# Patient Record
Sex: Female | Born: 1970 | Hispanic: Yes | Marital: Married | Smoking: Never smoker
Health system: Southern US, Community
[De-identification: ages and names within clinical notes are randomized; demographics above are authoritative.]

## PROBLEM LIST (undated history)

## (undated) DIAGNOSIS — I10 Essential (primary) hypertension: Secondary | ICD-10-CM

## (undated) HISTORY — PX: ABDOMINAL HYSTERECTOMY: SHX81

---

## 2003-12-27 ENCOUNTER — Ambulatory Visit: Payer: Self-pay | Admitting: Internal Medicine

## 2004-06-20 ENCOUNTER — Ambulatory Visit: Payer: Self-pay | Admitting: General Practice

## 2005-11-25 ENCOUNTER — Ambulatory Visit: Payer: Self-pay | Admitting: Family Medicine

## 2007-10-20 ENCOUNTER — Ambulatory Visit: Payer: Self-pay | Admitting: Family Medicine

## 2010-07-10 ENCOUNTER — Ambulatory Visit: Payer: Self-pay | Admitting: Family Medicine

## 2010-10-13 ENCOUNTER — Emergency Department: Payer: Self-pay | Admitting: Emergency Medicine

## 2011-07-16 ENCOUNTER — Ambulatory Visit: Payer: Self-pay | Admitting: Family Medicine

## 2012-09-02 ENCOUNTER — Ambulatory Visit: Payer: Self-pay | Admitting: Family Medicine

## 2012-11-30 ENCOUNTER — Ambulatory Visit: Payer: Self-pay | Admitting: Obstetrics and Gynecology

## 2013-02-16 ENCOUNTER — Ambulatory Visit: Payer: Self-pay | Admitting: Obstetrics and Gynecology

## 2013-02-16 LAB — COMPREHENSIVE METABOLIC PANEL
ALK PHOS: 107 U/L
ALT: 36 U/L (ref 12–78)
AST: 34 U/L (ref 15–37)
Albumin: 4.1 g/dL (ref 3.4–5.0)
Anion Gap: 6 — ABNORMAL LOW (ref 7–16)
BILIRUBIN TOTAL: 0.4 mg/dL (ref 0.2–1.0)
BUN: 10 mg/dL (ref 7–18)
CO2: 25 mmol/L (ref 21–32)
Calcium, Total: 9 mg/dL (ref 8.5–10.1)
Chloride: 106 mmol/L (ref 98–107)
Creatinine: 0.76 mg/dL (ref 0.60–1.30)
EGFR (African American): >60
Glucose: 84 mg/dL (ref 65–99)
Osmolality: 272 (ref 275–301)
Potassium: 3.8 mmol/L (ref 3.5–5.1)
SODIUM: 137 mmol/L (ref 136–145)
Total Protein: 7.8 g/dL (ref 6.4–8.2)

## 2013-02-16 LAB — HEMOGLOBIN: HGB: 13.5 g/dL (ref 12.0–16.0)

## 2013-02-22 ENCOUNTER — Ambulatory Visit: Payer: Self-pay | Admitting: Obstetrics and Gynecology

## 2013-02-23 LAB — BASIC METABOLIC PANEL
Anion Gap: 4 — ABNORMAL LOW (ref 7–16)
BUN: 9 mg/dL (ref 7–18)
CREATININE: 0.76 mg/dL (ref 0.60–1.30)
Calcium, Total: 8.5 mg/dL (ref 8.5–10.1)
Chloride: 108 mmol/L — ABNORMAL HIGH (ref 98–107)
Co2: 26 mmol/L (ref 21–32)
EGFR (Non-African Amer.): >60
GLUCOSE: 117 mg/dL — AB (ref 65–99)
Osmolality: 275 (ref 275–301)
Potassium: 3.8 mmol/L (ref 3.5–5.1)
Sodium: 138 mmol/L (ref 136–145)

## 2013-02-23 LAB — HEMATOCRIT: HCT: 37.8 % (ref 35.0–47.0)

## 2013-02-23 LAB — PATHOLOGY REPORT

## 2014-05-21 NOTE — Op Note (Signed)
PATIENT NAME:  Nicole Hale, [name] MR#:  [id number] DATE OF BIRTH:  [birth year]  DATE OF PROCEDURE:  02/22/2013  PREOPERATIVE DIAGNOSES: 1.  Chronic pelvic pain.  2.  Fibroid uterus.   POSTOPERATIVE DIAGNOSES:   1.  Chronic pelvic pain.  2.  Fibroid uterus.   PROCEDURES: 1.  Total vaginal hysterectomy.  2.  Bilateral salpingectomy.   ANESTHESIA:  General endotracheal.  SURGEON: Suzy Bouchardhomas J. Schermerhorn, M.D.   FIRST ASSISTANT: Logan BoresEvans.   INDICATIONS: This is a 44 year old gravida 2, para 2 patient with a symptomatic fibroid uterus and chronic pain, left greater than right. The patient is known to have diverticulosis.   PROCEDURE IN DETAIL: After adequate general endotracheal anesthesia, the patient was placed in the dorsal supine position. The abdomen, perineum and vagina were prepped in normal sterile fashion. A weighted speculum was placed into the posterior vaginal vault. The anterior cervix was then grasped with a thyroid tenaculum, and the cervix was circumferentially injected with 0.5% lidocaine with 1:100,000 epinephrine. A direct posterior colpotomy incision was made. Upon entry into the posterior cul-de-sac, a long-bill weighted speculum was placed. Uterosacral ligaments were bilaterally clamped, transected and suture ligated with 0 Vicryl suture. The anterior cervix was circumferentially incised with the Bovie, and the anterior cul-de-sac was entered without difficulty.  Deaver retractor placed into the anterior cul-de-sac to reflect the bladder anteriorly. The cardinal ligaments were bilaterally clamped, transected and suture ligated with 0 Vicryl suture. The uterine arteries were bilaterally clamped, transected and suture ligated with 0 Vicryl suture. The cornua were then clamped, transected and pedicles were ligated with 0 Vicryl suture. Of note, there was a 3 x 3 cm left fundal fibroid noted. The ovaries appeared normal. The fallopian tubes were then grasped and clamped and removed  separately, and the pedicles were closed with 0 Vicryl suture. Good hemostasis was noted. The peritoneum was then closed with a pursestring 2-0 PDS suture. The vaginal vault was closed with a running 0 Vicryl suture. The uterosacral ligaments were plicated centrally and the rest of the vaginal vault closed with 0 Vicryl suture. Good hemostasis was noted. No complications. Estimated blood loss 25 mL urine output at the beginning of case 50 mL, and a Foley was placed at the end and normal, clear urine resulted. Intraoperative fluids 700 mL. The patient did receive 2 grams of IV cefoxitin prior to commencement of the case.     ____________________________ Suzy Bouchardhomas J. Schermerhorn, MD tjs:dmm D: 02/22/2013 11:11:19 ET T: 02/22/2013 12:13:05 ET JOB#: [id number]  cc: Suzy Bouchardhomas J. Schermerhorn, MD, <Dictator> Suzy BouchardHOMAS J SCHERMERHORN MD ELECTRONICALLY SIGNED 03/05/2013 11:40

## 2014-08-17 ENCOUNTER — Other Ambulatory Visit: Payer: Self-pay | Admitting: Orthopedic Surgery

## 2014-08-17 DIAGNOSIS — M5416 Radiculopathy, lumbar region: Secondary | ICD-10-CM

## 2014-08-25 ENCOUNTER — Ambulatory Visit
Admission: RE | Admit: 2014-08-25 | Discharge: 2014-08-25 | Disposition: A | Discharge status: Home / Self Care | Payer: BC Managed Care – PPO | Source: Ambulatory Visit | Attending: Orthopedic Surgery | Admitting: Orthopedic Surgery

## 2014-08-25 DIAGNOSIS — M5126 Other intervertebral disc displacement, lumbar region: Secondary | ICD-10-CM | POA: Insufficient documentation

## 2014-08-25 DIAGNOSIS — M5416 Radiculopathy, lumbar region: Secondary | ICD-10-CM | POA: Diagnosis present

## 2014-10-04 ENCOUNTER — Other Ambulatory Visit: Payer: Self-pay | Admitting: Family Medicine

## 2014-10-04 DIAGNOSIS — Z1231 Encounter for screening mammogram for malignant neoplasm of breast: Secondary | ICD-10-CM

## 2014-10-19 ENCOUNTER — Ambulatory Visit
Admission: RE | Admit: 2014-10-19 | Discharge: 2014-10-19 | Disposition: A | Discharge status: Home / Self Care | Payer: BC Managed Care – PPO | Source: Ambulatory Visit | Attending: Family Medicine | Admitting: Family Medicine

## 2014-10-19 DIAGNOSIS — Z1231 Encounter for screening mammogram for malignant neoplasm of breast: Secondary | ICD-10-CM | POA: Diagnosis not present

## 2015-12-06 ENCOUNTER — Other Ambulatory Visit: Payer: Self-pay | Admitting: Family Medicine

## 2015-12-06 DIAGNOSIS — Z1231 Encounter for screening mammogram for malignant neoplasm of breast: Secondary | ICD-10-CM

## 2016-01-12 ENCOUNTER — Ambulatory Visit
Admission: RE | Admit: 2016-01-12 | Discharge: 2016-01-12 | Disposition: A | Discharge status: Home / Self Care | Payer: BC Managed Care – PPO | Source: Ambulatory Visit | Attending: Family Medicine | Admitting: Family Medicine

## 2016-01-12 DIAGNOSIS — Z1231 Encounter for screening mammogram for malignant neoplasm of breast: Secondary | ICD-10-CM | POA: Insufficient documentation

## 2016-11-06 ENCOUNTER — Other Ambulatory Visit: Payer: Self-pay | Admitting: Family Medicine

## 2016-11-06 DIAGNOSIS — Z1231 Encounter for screening mammogram for malignant neoplasm of breast: Secondary | ICD-10-CM

## 2017-02-13 ENCOUNTER — Ambulatory Visit
Admission: RE | Admit: 2017-02-13 | Discharge: 2017-02-13 | Disposition: A | Discharge status: Home / Self Care | Payer: BC Managed Care – PPO | Source: Ambulatory Visit | Attending: Family Medicine | Admitting: Family Medicine

## 2017-02-13 DIAGNOSIS — Z1231 Encounter for screening mammogram for malignant neoplasm of breast: Secondary | ICD-10-CM | POA: Diagnosis present

## 2018-02-16 ENCOUNTER — Other Ambulatory Visit: Payer: Self-pay | Admitting: Gastroenterology

## 2018-02-16 DIAGNOSIS — R945 Abnormal results of liver function studies: Principal | ICD-10-CM

## 2018-02-16 DIAGNOSIS — R7989 Other specified abnormal findings of blood chemistry: Secondary | ICD-10-CM

## 2018-02-19 ENCOUNTER — Ambulatory Visit
Admission: RE | Admit: 2018-02-19 | Discharge: 2018-02-19 | Disposition: A | Discharge status: Home / Self Care | Payer: BC Managed Care – PPO | Source: Ambulatory Visit | Attending: Gastroenterology | Admitting: Gastroenterology

## 2018-02-19 DIAGNOSIS — R945 Abnormal results of liver function studies: Secondary | ICD-10-CM | POA: Insufficient documentation

## 2018-02-19 DIAGNOSIS — R7989 Other specified abnormal findings of blood chemistry: Secondary | ICD-10-CM

## 2018-03-30 ENCOUNTER — Other Ambulatory Visit: Payer: Self-pay | Admitting: Family Medicine

## 2018-03-30 DIAGNOSIS — Z1231 Encounter for screening mammogram for malignant neoplasm of breast: Secondary | ICD-10-CM

## 2018-04-08 ENCOUNTER — Ambulatory Visit
Admission: RE | Admit: 2018-04-08 | Discharge: 2018-04-08 | Disposition: A | Discharge status: Home / Self Care | Payer: BC Managed Care – PPO | Source: Ambulatory Visit | Attending: Family Medicine | Admitting: Family Medicine

## 2018-04-08 ENCOUNTER — Other Ambulatory Visit: Payer: Self-pay

## 2018-04-08 DIAGNOSIS — Z1231 Encounter for screening mammogram for malignant neoplasm of breast: Secondary | ICD-10-CM | POA: Diagnosis not present

## 2018-08-11 ENCOUNTER — Emergency Department: Payer: BC Managed Care – PPO

## 2018-08-11 ENCOUNTER — Emergency Department
Admission: EM | Admit: 2018-08-11 | Discharge: 2018-08-11 | Disposition: A | Discharge status: Home / Self Care | Payer: BC Managed Care – PPO | Attending: Emergency Medicine | Admitting: Emergency Medicine

## 2018-08-11 ENCOUNTER — Other Ambulatory Visit: Payer: Self-pay

## 2018-08-11 DIAGNOSIS — W109XXA Fall (on) (from) unspecified stairs and steps, initial encounter: Secondary | ICD-10-CM | POA: Diagnosis not present

## 2018-08-11 DIAGNOSIS — Y9389 Activity, other specified: Secondary | ICD-10-CM | POA: Insufficient documentation

## 2018-08-11 DIAGNOSIS — S0990XA Unspecified injury of head, initial encounter: Secondary | ICD-10-CM | POA: Diagnosis present

## 2018-08-11 DIAGNOSIS — Y999 Unspecified external cause status: Secondary | ICD-10-CM | POA: Diagnosis not present

## 2018-08-11 DIAGNOSIS — Q7192 Unspecified reduction defect of left upper limb: Secondary | ICD-10-CM

## 2018-08-11 DIAGNOSIS — S52502A Unspecified fracture of the lower end of left radius, initial encounter for closed fracture: Secondary | ICD-10-CM

## 2018-08-11 DIAGNOSIS — S52602A Unspecified fracture of lower end of left ulna, initial encounter for closed fracture: Secondary | ICD-10-CM | POA: Diagnosis not present

## 2018-08-11 DIAGNOSIS — S01411A Laceration without foreign body of right cheek and temporomandibular area, initial encounter: Secondary | ICD-10-CM | POA: Diagnosis not present

## 2018-08-11 DIAGNOSIS — Y92009 Unspecified place in unspecified non-institutional (private) residence as the place of occurrence of the external cause: Secondary | ICD-10-CM | POA: Insufficient documentation

## 2018-08-11 MED ORDER — OXYCODONE-ACETAMINOPHEN 5-325 MG PO TABS: 1.0000 | ORAL_TABLET | ORAL | 0 refills | Status: AC | PRN

## 2018-08-11 MED ORDER — LIDOCAINE HCL (PF) 1 % IJ SOLN
5.0000 mL | Freq: Once | INTRAMUSCULAR | Status: AC
  Administered 2018-08-11: 17:00:00 5 mL via INTRADERMAL
  Filled 2018-08-11: qty 5

## 2018-08-11 MED ORDER — HYDROMORPHONE HCL 1 MG/ML IJ SOLN
1.0000 mg | INTRAMUSCULAR | Status: AC
  Administered 2018-08-11: 18:00:00 1 mg via INTRAVENOUS
  Filled 2018-08-11: qty 1

## 2018-08-11 MED ORDER — ONDANSETRON 4 MG PO TBDP: 4.0000 mg | ORAL_TABLET | Freq: Once | ORAL | Status: DC

## 2018-08-11 MED ORDER — ETOMIDATE 2 MG/ML IV SOLN
0.3000 mg/kg | Freq: Once | INTRAVENOUS | Status: AC
  Administered 2018-08-11: 21.52 mg via INTRAVENOUS
  Filled 2018-08-11: qty 20

## 2018-08-11 MED ORDER — ONDANSETRON HCL 4 MG/2ML IJ SOLN
4.0000 mg | Freq: Once | INTRAMUSCULAR | Status: AC
  Administered 2018-08-11: 15:00:00 4 mg via INTRAVENOUS

## 2018-08-11 MED ORDER — MORPHINE SULFATE (PF) 2 MG/ML IV SOLN
1.0000 mg | Freq: Once | INTRAVENOUS | Status: DC
Start: 2018-08-11 — End: 2018-08-11

## 2018-08-11 MED ORDER — ONDANSETRON 4 MG PO TBDP: 4.0000 mg | ORAL_TABLET | Freq: Three times a day (TID) | ORAL | 0 refills | Status: AC | PRN

## 2018-08-11 MED ORDER — MORPHINE SULFATE (PF) 2 MG/ML IV SOLN
2.0000 mg | Freq: Once | INTRAVENOUS | Status: AC
  Administered 2018-08-11: 2 mg via INTRAVENOUS
  Filled 2018-08-11: qty 1

## 2018-08-11 MED ORDER — OXYCODONE-ACETAMINOPHEN 5-325 MG PO TABS
1.0000 | ORAL_TABLET | Freq: Once | ORAL | Status: AC
  Administered 2018-08-11: 20:00:00 1 via ORAL
  Filled 2018-08-11: qty 1

## 2018-08-11 MED ORDER — IBUPROFEN 200 MG PO TABS: 600.0000 mg | ORAL_TABLET | Freq: Four times a day (QID) | ORAL | 0 refills | Status: AC | PRN

## 2018-08-11 MED ORDER — ONDANSETRON HCL 4 MG/2ML IJ SOLN
INTRAMUSCULAR | Status: AC
  Administered 2018-08-11: 4 mg via INTRAVENOUS
  Filled 2018-08-11: qty 2

## 2018-08-11 MED ORDER — LIDOCAINE HCL (PF) 1 % IJ SOLN
5.0000 mL | Freq: Once | INTRAMUSCULAR | Status: AC
  Administered 2018-08-11: 19:00:00 5 mL
  Filled 2018-08-11: qty 5

## 2018-08-11 MED ORDER — TETANUS-DIPHTH-ACELL PERTUSSIS 5-2.5-18.5 LF-MCG/0.5 IM SUSP
0.5000 mL | Freq: Once | INTRAMUSCULAR | Status: AC
  Administered 2018-08-11: 15:00:00 0.5 mL via INTRAMUSCULAR
  Filled 2018-08-11: qty 0.5

## 2018-08-11 NOTE — Consult Note (Addendum)
Called by ER PA, Caryl Pina, to review xrays on Nicole Hale who is a 48 year old female who sustained a fall at home injuring her left wrist.  Patient also sustained a right sided scalp laceration which has been repaired by PA.  The patient is reported by PA to have a closed left wrist injury and is neurovascularly intact.  I reviewed the wrist xrays which demonstrate a comminuted, displaced left distal radius fracture with dorsal angulation of 45 degrees.  There is loss of radial height and medial translation of the distal radial fragment on the AP view.  I have reviewed the xrays with our hand specialist, Dr. Peggye Ley who has agreed to see patient in follow up on Thursday for evaluation and surgical planning.   CT scan of the left wrist has been ordered by me for Dr. Cherylann Parr review on Thursday for surgical planning.   Patient can be reduced by ER staff, if possible, and placed in sugar tong splint and discharged since she is neurovascularly intact.  Patient should continue to elevate the left forearm until she sees Dr. Peggye Ley on Thursday here in our office.

## 2018-08-11 NOTE — Discharge Instructions (Addendum)
Your x-ray shows a fracture to your left wrist that you will need to see orthopedics for.  Please call Dr. Cherylann Parr office in the morning for an appointment on Thursday.  She will be expecting you.

## 2018-08-11 NOTE — ED Provider Notes (Signed)
[facility] Emergency Department Provider Note  ____________________________________________  Time seen: Approximately 1:44 PM  I have reviewed the triage vital signs and the nursing notes.   HISTORY  Chief Complaint Fall, Laceration (L side of scalp), and Wrist Injury    HPI Nicole Hale is a 48 y.o. female that presents to emergency department for evaluation of wrist pain and facial laceration after a fall today.  Patient states that she was carrying a bucket of tiles to redo her bathroom when she fell off of her steps.  She used her left wrist to try to brace her fall.  She hit her head on the concrete stoop when she landed.  She did not lose consciousness.  She is unsure of last tetanus.  No numbness, tingling.   History reviewed. No pertinent past medical history.  There are no active problems to display for this patient.   Past Surgical History:  Procedure Laterality Date  . ABDOMINAL HYSTERECTOMY      Prior to Admission medications   Not on File    Allergies Patient has no allergy information on record.  History reviewed. No pertinent family history.  Social History Social History   Tobacco Use  . Smoking status: Never Smoker  Substance Use Topics  . Alcohol use: Not Currently  . Drug use: Never     Review of Systems  Respiratory:No SOB. Gastrointestinal: No abdominal pain.  No nausea, no vomiting.  Musculoskeletal: Positive for wrist pain.  Skin: Negative for  ecchymosis. Positive for abrasion and laceration.  Neurological: Negative for headaches, numbness or tingling   ____________________________________________   PHYSICAL EXAM:  VITAL SIGNS: ED Triage Vitals  Enc Vitals Group     BP 08/11/18 1320 135/67     Pulse Rate 08/11/18 1320 73     Resp 08/11/18 1320 20     Temp 08/11/18 1320 98 F (36.7 C)     Temp Source 08/11/18 1320 Oral     SpO2 08/11/18 1320 94 %     Weight 08/11/18 1326 158 lb (71.7 kg)      Height 08/11/18 1326 5\' 1"  (1.549 m)     Head Circumference --      Peak Flow --      Pain Score 08/11/18 1324 6     Pain Loc --      Pain Edu? --      Excl. in [place]? --      Constitutional: Alert and oriented. Well appearing and in no acute distress. Eyes: Conjunctivae are normal. PERRL. EOMI. Head: Large friction burn with bright red moist raw skin to right cheek with overlying 3 cm laceration. ENT:      Ears:      Nose: No congestion/rhinnorhea.      Mouth/Throat: Mucous membranes are moist.  Neck: No stridor. No cervical spine tenderness to palpation. Cardiovascular: Normal rate, regular rhythm.  Good peripheral circulation. Symmetric radial pulses bilaterally. Respiratory: Normal respiratory effort without tachypnea or retractions. Lungs CTAB. Good air entry to the bases with no decreased or absent breath sounds. Gastrointestinal: Bowel sounds 4 quadrants. Soft and nontender to palpation. No guarding or rigidity. No palpable masses. No distention. Musculoskeletal: Full range of motion to all extremities. Deformity to left wrist. Neurologic:  Normal speech and language. No gross focal neurologic deficits are appreciated.  Skin:  Skin is warm, dry and intact. No rash noted. Psychiatric: Mood and affect are normal. Speech and behavior are normal. Patient exhibits appropriate insight and  judgement.   ____________________________________________   LABS (all labs ordered are listed, but only abnormal results are displayed)  Labs Reviewed - No data to display ____________________________________________  EKG   ____________________________________________  RADIOLOGY Lexine BatonI, Danni Leabo, personally viewed and evaluated these images (plain radiographs) as part of my medical decision making, as well as reviewing the written report by the radiologist.  Dg Wrist Complete Left  Result Date: 08/11/2018 CLINICAL DATA:  Status post fall with left wrist pain. EXAM: LEFT WRIST - COMPLETE  3+ VIEW COMPARISON:  None. FINDINGS: There is comminuted displaced angulated impacted fracture of the distal radius. Minimal displaced fracture of the ulna styloid is identified. IMPRESSION: Fractures of distal ulna and radius. Electronically Signed   By: Sherian ReinWei-Chen  Lin M.D.   On: 08/11/2018 14:16   Ct Head Wo Contrast  Result Date: 08/11/2018 CLINICAL DATA:  Head laceration after fall. EXAM: CT HEAD WITHOUT CONTRAST CT CERVICAL SPINE WITHOUT CONTRAST TECHNIQUE: Multidetector CT imaging of the head and cervical spine was performed following the standard protocol without intravenous contrast. Multiplanar CT image reconstructions of the cervical spine were also generated. COMPARISON:  None. FINDINGS: CT HEAD FINDINGS Brain: No evidence of acute infarction, hemorrhage, hydrocephalus, extra-axial collection or mass lesion/mass effect. Vascular: No hyperdense vessel or unexpected calcification. Skull: Normal. Negative for fracture or focal lesion. Sinuses/Orbits: No acute finding. Other: None. CT CERVICAL SPINE FINDINGS Alignment: Normal. Skull base and vertebrae: No acute fracture. No primary bone lesion or focal pathologic process. Soft tissues and spinal canal: No prevertebral fluid or swelling. No visible canal hematoma. Disc levels:  Moderate degenerative disc disease is noted at C6-7. Upper chest: Negative. Other: None. IMPRESSION: Normal head CT. Moderate degenerative disc disease is noted at C6-7. No acute abnormality seen in the cervical spine. Electronically Signed   By: Lupita RaiderJames  Green Jr M.D.   On: 08/11/2018 14:19   Ct Cervical Spine Wo Contrast  Result Date: 08/11/2018 CLINICAL DATA:  Head laceration after fall. EXAM: CT HEAD WITHOUT CONTRAST CT CERVICAL SPINE WITHOUT CONTRAST TECHNIQUE: Multidetector CT imaging of the head and cervical spine was performed following the standard protocol without intravenous contrast. Multiplanar CT image reconstructions of the cervical spine were also generated.  COMPARISON:  None. FINDINGS: CT HEAD FINDINGS Brain: No evidence of acute infarction, hemorrhage, hydrocephalus, extra-axial collection or mass lesion/mass effect. Vascular: No hyperdense vessel or unexpected calcification. Skull: Normal. Negative for fracture or focal lesion. Sinuses/Orbits: No acute finding. Other: None. CT CERVICAL SPINE FINDINGS Alignment: Normal. Skull base and vertebrae: No acute fracture. No primary bone lesion or focal pathologic process. Soft tissues and spinal canal: No prevertebral fluid or swelling. No visible canal hematoma. Disc levels:  Moderate degenerative disc disease is noted at C6-7. Upper chest: Negative. Other: None. IMPRESSION: Normal head CT. Moderate degenerative disc disease is noted at C6-7. No acute abnormality seen in the cervical spine. Electronically Signed   By: Lupita RaiderJames  Green Jr M.D.   On: 08/11/2018 14:19   Ct Wrist Left Wo Contrast  Result Date: 08/11/2018 CLINICAL DATA:  The patient suffered a left wrist fracture in a fall down stairs today. Initial encounter. EXAM: CT OF THE LEFT WRIST WITHOUT CONTRAST TECHNIQUE: Multidetector CT imaging was performed according to the standard protocol. Multiplanar CT image reconstructions were also generated. COMPARISON:  None. FINDINGS: Bones/Joint/Cartilage As seen on the comparison plain films, the patient has a comminuted fracture through the distal metaphysis of the radius. The distal fragment demonstrates dorsal displacement approximately 0.7 cm and the fracture demonstrates dorsal  angulation approximately 40 degrees. The vast majority of the articular surface is intact. There is a tiny nondisplaced component of the fracture at the dorsal lip of the articular surface. The patient also has a nondisplaced fracture of the ulnar styloid. No other fracture is identified. No dislocation. No evidence of arthropathy. Ligaments Suboptimally assessed by CT. Muscles and Tendons Intact and normal in appearance. Soft tissues Soft  tissue swelling about the patient's fracture noted. IMPRESSION: Comminuted fracture metaphysis of the distal radius with dorsal angulation and displacement as described above. The fracture extends only through the posterior lip of the articular surface of the radius. The vast majority of the articular surface is intact. Nondisplaced ulnar styloid fracture. Electronically Signed   By: Drusilla Kannerhomas  Dalessio M.D.   On: 08/11/2018 17:06   Ct Maxillofacial Wo Contrast  Result Date: 08/11/2018 CLINICAL DATA:  Recent fall down stairs with facial pain, initial encounter EXAM: CT MAXILLOFACIAL WITHOUT CONTRAST TECHNIQUE: Multidetector CT imaging of the maxillofacial structures was performed. Multiplanar CT image reconstructions were also generated. COMPARISON:  None. FINDINGS: Osseous: No acute fracture is identified. Orbits: Orbits and their contents are within normal limits. Sinuses: Paranasal sinuses are unremarkable. Soft tissues: Soft tissues demonstrate swelling along the right side of the head and extending into the right cheek consistent with the recent injury. No other soft tissue abnormality is noted. Limited intracranial: No significant or unexpected finding. IMPRESSION: No acute fracture noted.  Right-sided facial swelling is noted. Electronically Signed   By: Alcide CleverMark  Lukens M.D.   On: 08/11/2018 15:51    ____________________________________________    PROCEDURES  Procedure(s) performed:    Procedures  LACERATION REPAIR Performed by: Enid DerryAshley Maddalyn Lutze  Consent: Verbal consent obtained.  Consent given by: patient  Prepped and Draped in normal sterile fashion  Wound explored: No foreign bodies   Laceration Location: right cheek and eyebrow  Laceration Length: 3 cm  Anesthesia: None  Local anesthetic: lidocaine 1% without epinephrine  Anesthetic total: 4 ml  Irrigation method: syringe  Amount of cleaning: 500ml normal saline  Skin closure: 5-0 nylon  Number of sutures:  11  Technique: Simple interrupted  Patient tolerance: Patient tolerated the procedure well with no immediate complications.    ____________________________________________   INITIAL IMPRESSION / ASSESSMENT AND PLAN / ED COURSE  Pertinent labs & imaging results that were available during my care of the patient were reviewed by me and considered in my medical decision making (see chart for details).  Review of the [place] CSRS was performed in accordance of the [facility] prior to dispensing any controlled drugs.   Patient presents emergency department for evaluation after fall.  Vital signs and exam are reassuring.  CT head, cervical, maxillofacial are negative for acute abnormalities.  Laceration was repaired with stitches.  Tdap was updated.  Left wrist x-ray consistent with distal radial comminute fracture and ulnar styloid fracture.  Dr. Martha ClanKrasinski was consulted, reviewed films, and recommends reduction be attempted in the emergency department and that patient follows up with Dr. Stephenie AcresSoria in clinic on Thursday.  Patient will be transferred to main side ED for reduction.  Report was given to Dr. Scotty CourtStafford.   Nicole Hale was evaluated in Emergency Department on 08/11/2018 for the symptoms described in the history of present illness. She was evaluated in the context of the global COVID-19 pandemic, which necessitated consideration that the patient might be at risk for infection with the SARS-CoV-2 virus that causes COVID-19. Institutional protocols and algorithms that pertain to the evaluation of  patients at risk for COVID-19 are in a state of rapid change based on information released by regulatory bodies including the CDC and federal and state organizations. These policies and algorithms were followed during the patient's care in the ED.  ____________________________________________  FINAL CLINICAL IMPRESSION(S) / ED DIAGNOSES  Final diagnoses:  None      NEW MEDICATIONS STARTED DURING THIS  VISIT:  ED Discharge Orders    None          This chart was dictated using voice recognition software/Dragon. Despite best efforts to proofread, errors can occur which can change the meaning. Any change was purely unintentional.    Enid DerryWagner, [employer]ft, PA-C 08/11/18 1821    Sharman CheekStafford, Phillip, MD 08/11/18 [phone]

## 2018-08-11 NOTE — ED Notes (Addendum)
Wrist is reset. ED tech at bedside to assist with splint. Sling applied as well.

## 2018-08-11 NOTE — ED Notes (Signed)
Oxygen decreased to 2 liters. Pt tolerating well. Sats are 100%

## 2018-08-11 NOTE — ED Notes (Signed)
Etomidate in. MD now injecting lidocaine around wrist

## 2018-08-11 NOTE — ED Notes (Signed)
Pt now has eyes open and is beginning to arouse.

## 2018-08-11 NOTE — ED Notes (Signed)
Pt is oriented to self, place, time and situation but is still slightly sedated. Will continue to monitor.

## 2018-08-11 NOTE — ED Notes (Signed)
Pt asking questions but remains sedated. Will continue to monitor.

## 2018-08-11 NOTE — ED Notes (Signed)
Xray completed. Pt is talkative but repetitive in speech.

## 2018-08-11 NOTE — ED Notes (Signed)
o2 increased to 4 liters. Sats dropped to 89%

## 2018-08-11 NOTE — ED Notes (Signed)
Pt signed consents on paper for both procedures.

## 2018-08-11 NOTE — ED Triage Notes (Signed)
Pt arrives to ED from home via [facility] with c/c of left wrist pain and right side scalp laceration s/p mechanical fall down 4 exterior steps. Pt reports losing balance as she stepped out her door and falling to the right. She reached out with her left hand to catch herself and felt pain in her wrist when she impacted the wall. She fell forward down the steps and hit the right side of her head on the concrete stoop when she landed. Pt denies any LoC, visual/auditory changes. EMS reports transport vitals of 121/79, p90, O2 sat 96% on room air. Pt given 164mcg fentanyl during transport for pain. 20G placed in R wrist. Upon arrival, pt A&Ox4, in pain from obvious deformity to left wrist. Distal pulses intact, cap refill <3 sec.

## 2018-08-11 NOTE — ED Provider Notes (Signed)
[facility]Ortho Injury Treatment  Date/Time: 08/11/2018 7:18 PM Performed by: Sharman CheekStafford, Arena Lindahl, MD Authorized by: Sharman CheekStafford, Lashaun Poch, MD   Consent:    Consent obtained:  Verbal and written   Consent given by:  Patient   Risks discussed:  Nerve damage, vascular damage and irreducible dislocation   Alternatives discussed:  Immobilization and referralInjury location: wrist Location details: left wrist Injury type: fracture Fracture type: distal radius and ulnar styloid Pre-procedure neurovascular assessment: neurovascularly intact Pre-procedure distal perfusion: normal Pre-procedure neurological function: normal Pre-procedure range of motion: reduced Anesthesia: hematoma block  Anesthesia: Local anesthesia used: yes Local Anesthetic: lidocaine 1% without epinephrine Anesthetic total: 5 mL  Patient sedated: Yes. Refer to sedation procedure documentation for details of sedation. Manipulation performed: yes Skin traction used: no Skeletal traction used: yes Reduction successful: yes (improved, suboptimal alignment) X-ray confirmed reduction: yes Immobilization: splint and sling Splint type: sugar tong Supplies used: cotton padding,  elastic bandage and Ortho-Glass Post-procedure neurovascular assessment: post-procedure neurovascularly intact Post-procedure distal perfusion: normal Post-procedure neurological function: normal Post-procedure range of motion: unchanged Patient tolerance: patient tolerated the procedure well with no immediate complications Comments:     .Sedation  Date/Time: 08/11/2018 7:20 PM Performed by: Sharman CheekStafford, Javelle Donigan, MD Authorized by: Sharman CheekStafford, Helia Haese, MD   Consent:    Consent obtained:  Written (electronic informed consent)   Consent given by:  Patient   Risks discussed:  Allergic reaction, dysrhythmia, inadequate sedation, nausea, vomiting, respiratory compromise necessitating ventilatory assistance and intubation, prolonged sedation necessitating reversal  and prolonged hypoxia resulting in organ damage   Alternatives discussed:  Analgesia without sedation and regional anesthesia Universal protocol:    Procedure explained and questions answered to patient or proxy's satisfaction: yes     Relevant documents present and verified: yes     Test results available and properly labeled: yes     Imaging studies available: yes     Required blood products, implants, devices, and special equipment available: yes     Site/side marked: yes     Immediately prior to procedure a time out was called: yes     Patient identity confirmation method:  Arm band Indications:    Procedure performed:  Fracture reduction   Procedure necessitating sedation performed by:  Physician performing sedation Pre-sedation assessment:    Time since last food or drink:  10 hours   ASA classification: class 1 - normal, healthy patient     Neck mobility: normal     Mouth opening:  3 or more finger widths   Mallampati score:  I - soft palate, uvula, fauces, pillars visible   Pre-sedation assessments completed and reviewed: airway patency, cardiovascular function, hydration status, mental status, nausea/vomiting, pain level and respiratory function     Pre-sedation assessment completed:  08/11/2018 6:00 PM Immediate pre-procedure details:    Reassessment: Patient reassessed immediately prior to procedure     Reviewed: vital signs, relevant labs/tests and NPO status     Verified: bag valve mask available, emergency equipment available, intubation equipment available, IV patency confirmed, oxygen available, reversal medications available and suction available   Procedure details (see MAR for exact dosages):    Preoxygenation:  Nasal cannula   Sedation:  Etomidate   Analgesia:  Hydromorphone   Intra-procedure monitoring:  Blood pressure monitoring, continuous pulse oximetry, cardiac monitor, frequent vital sign checks, frequent LOC assessments and continuous capnometry    Intra-procedure events: none     Total Provider sedation time (minutes):  20 Post-procedure details:    Post-sedation assessment completed:  08/11/2018  7:21 PM   Attendance: Constant attendance by certified staff until patient recovered     Recovery: Patient returned to pre-procedure baseline     Post-sedation assessments completed and reviewed: airway patency, cardiovascular function, hydration status, mental status, nausea/vomiting, pain level and respiratory function     Patient is stable for discharge or admission: yes     Patient tolerance:  Tolerated well, no immediate complications      Clinical Course as of Aug 11 1915  Tue Aug 11, 2018  1843 Sedation/reduction/splinting completed, satisfactory clniically. Pt waking up. F/u xray being done now.    [PS]    Clinical Course User Index [PS] Carrie Mew, MD    ----------------------------------------- 7:17 PM on 08/11/2018 -----------------------------------------  Repeat x-ray shows suboptimal but improved alignment of the distal radius fracture.  Angulation is improved, there is still some mild displacement.  Clinically and reduction there is significant instability of the fracture segment.  Patient reports pain is improved, there is no neurologic symptom or deficit, good distal perfusion, clinically stable for outpatient follow-up with orthopedics.   Final diagnoses:  Closed fracture of left distal radius and ulna, initial encounter         Carrie Mew, MD 08/11/18 Curly Rim

## 2019-03-08 ENCOUNTER — Other Ambulatory Visit: Payer: Self-pay | Admitting: Family Medicine

## 2019-03-08 DIAGNOSIS — Z1231 Encounter for screening mammogram for malignant neoplasm of breast: Secondary | ICD-10-CM

## 2019-03-27 ENCOUNTER — Ambulatory Visit: Payer: BC Managed Care – PPO

## 2019-04-09 ENCOUNTER — Ambulatory Visit
Admission: RE | Admit: 2019-04-09 | Discharge: 2019-04-09 | Disposition: A | Discharge status: Home / Self Care | Payer: BC Managed Care – PPO | Source: Ambulatory Visit | Attending: Family Medicine | Admitting: Family Medicine

## 2019-04-09 DIAGNOSIS — Z1231 Encounter for screening mammogram for malignant neoplasm of breast: Secondary | ICD-10-CM | POA: Diagnosis not present

## 2020-07-25 ENCOUNTER — Other Ambulatory Visit: Payer: Self-pay | Admitting: Family Medicine

## 2020-07-25 DIAGNOSIS — Z1231 Encounter for screening mammogram for malignant neoplasm of breast: Secondary | ICD-10-CM

## 2020-08-11 ENCOUNTER — Other Ambulatory Visit: Payer: Self-pay

## 2020-08-11 ENCOUNTER — Ambulatory Visit
Admission: RE | Admit: 2020-08-11 | Discharge: 2020-08-11 | Disposition: A | Discharge status: Home / Self Care | Payer: BC Managed Care – PPO | Source: Ambulatory Visit | Attending: Family Medicine | Admitting: Family Medicine

## 2020-08-11 DIAGNOSIS — Z1231 Encounter for screening mammogram for malignant neoplasm of breast: Secondary | ICD-10-CM | POA: Insufficient documentation

## 2021-07-06 ENCOUNTER — Other Ambulatory Visit: Payer: Self-pay | Admitting: Family Medicine

## 2021-07-06 DIAGNOSIS — Z1231 Encounter for screening mammogram for malignant neoplasm of breast: Secondary | ICD-10-CM

## 2021-08-21 ENCOUNTER — Ambulatory Visit
Admission: RE | Admit: 2021-08-21 | Discharge: 2021-08-21 | Disposition: A | Discharge status: Home / Self Care | Payer: BC Managed Care – PPO | Source: Ambulatory Visit | Attending: Family Medicine | Admitting: Family Medicine

## 2021-08-21 DIAGNOSIS — Z1231 Encounter for screening mammogram for malignant neoplasm of breast: Secondary | ICD-10-CM | POA: Insufficient documentation

## 2022-10-22 ENCOUNTER — Other Ambulatory Visit: Payer: Self-pay

## 2022-10-22 ENCOUNTER — Emergency Department
Admission: EM | Admit: 2022-10-22 | Discharge: 2022-10-22 | Disposition: A | Discharge status: Home / Self Care | Payer: BC Managed Care – PPO | Attending: Emergency Medicine | Admitting: Emergency Medicine

## 2022-10-22 DIAGNOSIS — R109 Unspecified abdominal pain: Secondary | ICD-10-CM

## 2022-10-22 DIAGNOSIS — R1012 Left upper quadrant pain: Secondary | ICD-10-CM | POA: Diagnosis present

## 2022-10-22 HISTORY — DX: Essential (primary) hypertension: I10

## 2022-10-22 LAB — COMPREHENSIVE METABOLIC PANEL
ALT: 18 U/L (ref 0–44)
AST: 22 U/L (ref 15–41)
Albumin: 3.8 g/dL (ref 3.5–5.0)
Alkaline Phosphatase: 113 U/L (ref 38–126)
Anion gap: 9 (ref 5–15)
BUN: 8 mg/dL (ref 6–20)
CO2: 25 mmol/L (ref 22–32)
Calcium: 9 mg/dL (ref 8.9–10.3)
Chloride: 105 mmol/L (ref 98–111)
Creatinine, Ser: 0.61 mg/dL (ref 0.44–1.00)
GFR, Estimated: >60 mL/min (ref >60)
Glucose, Bld: 109 mg/dL — ABNORMAL HIGH (ref 70–99)
Potassium: 3.6 mmol/L (ref 3.5–5.1)
Sodium: 139 mmol/L (ref 135–145)
Total Bilirubin: 0.8 mg/dL (ref 0.3–1.2)
Total Protein: 6.9 g/dL (ref 6.5–8.1)

## 2022-10-22 LAB — CBC
HCT: 36.9 % (ref 36.0–46.0)
Hemoglobin: 12 g/dL (ref 12.0–15.0)
MCH: 28.9 pg (ref 26.0–34.0)
MCHC: 32.5 g/dL (ref 30.0–36.0)
MCV: 88.9 fL (ref 80.0–100.0)
Platelets: 370 10*3/uL (ref 150–400)
RBC: 4.15 MIL/uL (ref 3.87–5.11)
RDW: 13.2 % (ref 11.5–15.5)
WBC: 9.9 10*3/uL (ref 4.0–10.5)
nRBC: 0 % (ref 0.0–0.2)

## 2022-10-22 LAB — LIPASE, BLOOD: Lipase: 20 U/L (ref 11–51)

## 2022-10-22 MED ORDER — SODIUM CHLORIDE 0.9 % IV BOLUS
1000.0000 mL | Freq: Once | INTRAVENOUS | Status: AC
  Administered 2022-10-22: 1000 mL via INTRAVENOUS

## 2022-10-22 MED ORDER — FAMOTIDINE IN NACL 20-0.9 MG/50ML-% IV SOLN
20.0000 mg | Freq: Once | INTRAVENOUS | Status: AC
  Administered 2022-10-22: 20 mg via INTRAVENOUS
  Filled 2022-10-22: qty 50

## 2022-10-22 MED ORDER — DICYCLOMINE HCL 10 MG PO CAPS
10.0000 mg | ORAL_CAPSULE | Freq: Once | ORAL | Status: AC
  Administered 2022-10-22: 10 mg via ORAL
  Filled 2022-10-22: qty 1

## 2022-10-22 MED ORDER — FAMOTIDINE 20 MG PO TABS
20.0000 mg | ORAL_TABLET | Freq: Two times a day (BID) | ORAL | 1 refills | Status: AC
Start: 2022-10-22 — End: 2023-10-22

## 2022-10-22 MED ORDER — DICYCLOMINE HCL 20 MG PO TABS: 20.0000 mg | ORAL_TABLET | Freq: Four times a day (QID) | ORAL | 1 refills | Status: AC

## 2022-10-22 NOTE — ED Provider Notes (Signed)
[facility] Provider Note    Event Date/Time   First MD Initiated Contact with Patient 10/22/22 1714     (approximate)   History   Abdominal Pain   HPI  Nicole Hale is a 52 y.o. female with history of abdominal pain presents emergency department complaining of continued abdominal pain and burning in the left upper quadrant, some bloating and feels like she has a lot of gas.  This has been ongoing for 3 to 4 months.  Has seen GI and they started her on [place] fate and then switched her to Protonix.  States she does not feel these medications are helping her.  She has had no fever.  No change in location of the pain.  No blood in her stool.  No vomiting      Physical Exam   Triage Vital Signs: ED Triage Vitals [10/22/22 1551]  Encounter Vitals Group     BP 134/85     Systolic BP Percentile      Diastolic BP Percentile      Pulse Rate 80     Resp 18     Temp 98.5 F (36.9 C)     Temp src      SpO2 97 %     Weight 132 lb (59.9 kg)     Height 5\' 1"  (1.549 m)     Head Circumference      Peak Flow      Pain Score 8     Pain Loc      Pain Education      Exclude from Growth Chart     Most recent vital signs: Vitals:   10/22/22 1551  BP: 134/85  Pulse: 80  Resp: 18  Temp: 98.5 F (36.9 C)  SpO2: 97%     General: Awake, no distress.   CV:  Good peripheral perfusion. regular rate and  rhythm Resp:  Normal effort. Lungs cta Abd:  No distention.  Mildly tender in the left upper quadrant, abdominal spasms noted, bowel sounds normal Other:      ED Results / Procedures / Treatments   Labs (all labs ordered are listed, but only abnormal results are displayed) Labs Reviewed  COMPREHENSIVE METABOLIC PANEL - Abnormal; Notable for the following components:      Result Value   Glucose, Bld 109 (*)    All other components within normal limits  LIPASE, BLOOD  CBC  URINALYSIS, ROUTINE W REFLEX MICROSCOPIC      EKG     RADIOLOGY     PROCEDURES:   Procedures   MEDICATIONS ORDERED IN ED: Medications  sodium chloride 0.9 % bolus 1,000 mL (0 mLs Intravenous Stopped 10/22/22 1836)  famotidine (PEPCID) IVPB 20 mg premix (0 mg Intravenous Stopped 10/22/22 1836)  dicyclomine (BENTYL) capsule 10 mg (10 mg Oral Given 10/22/22 1736)     IMPRESSION / MDM / ASSESSMENT AND PLAN / ED COURSE  I reviewed the triage vital signs and the nursing notes.                              Differential diagnosis includes, but is not limited to, PUD, bowel obstruction, gluten intolerance, IBS, celiac, H. pylori, pancreatitis  Patient's presentation is most consistent with acute illness / injury with system symptoms.   Patient's labs are reassuring I feel that pancreatitis, bowel obstruction are less likely.  I did discuss the findings with the patient.  Feel she may have a gluten intolerance due to the amount of bloating, we gave her Pepcid 20 mg IV, Bentyl 10 mg p.o.  Patient had relief with these medications.  States she is feeling better.  Sent a prescription for Pepcid and Bentyl to the pharmacy.  Told her she could stop the Protonix and the [place] fate.  Follow-up with her regular GI doctor.  Follow-up with her regular doctor as needed.  Return emergency department if worsening.  She is in agreement treatment plan.  She was discharged stable condition.     FINAL CLINICAL IMPRESSION(S) / ED DIAGNOSES   Final diagnoses:  Abdominal pain, unspecified abdominal location     Rx / DC Orders   ED Discharge Orders          Ordered    famotidine (PEPCID) 20 MG tablet  2 times daily        10/22/22 1822    dicyclomine (BENTYL) 20 MG tablet  Every 6 hours        10/22/22 1822             Note:  This document was prepared using Dragon voice recognition software and may include unintentional dictation errors.    Faythe Ghee, PA-C 10/22/22 2020    Janith Lima, MD 10/22/22  [phone]

## 2022-10-22 NOTE — ED Triage Notes (Signed)
Pt to ED for left sided abd pain for 2 months. +n/v Reports hx of elevated LFTs

## 2023-04-17 IMAGING — MG MM DIGITAL SCREENING BILAT W/ TOMO AND CAD
8 series · 8 of 24 positions shown · non-contrast
Comparison: Previous exam(s).

CLINICAL DATA: Screening.

EXAM:
DIGITAL SCREENING BILATERAL MAMMOGRAM WITH TOMOSYNTHESIS AND CAD
TECHNIQUE: Bilateral screening digital craniocaudal and mediolateral oblique
mammograms were obtained. Bilateral screening digital breast
tomosynthesis was performed. The images were evaluated with
computer-aided detection.

[R MLO synth-2D]
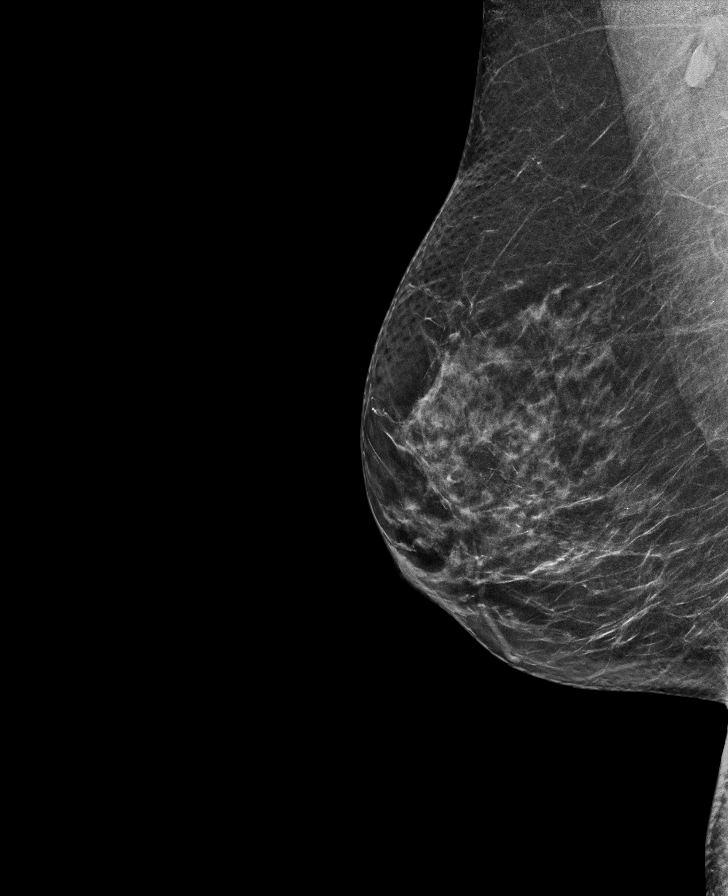

[R CC synth-2D]
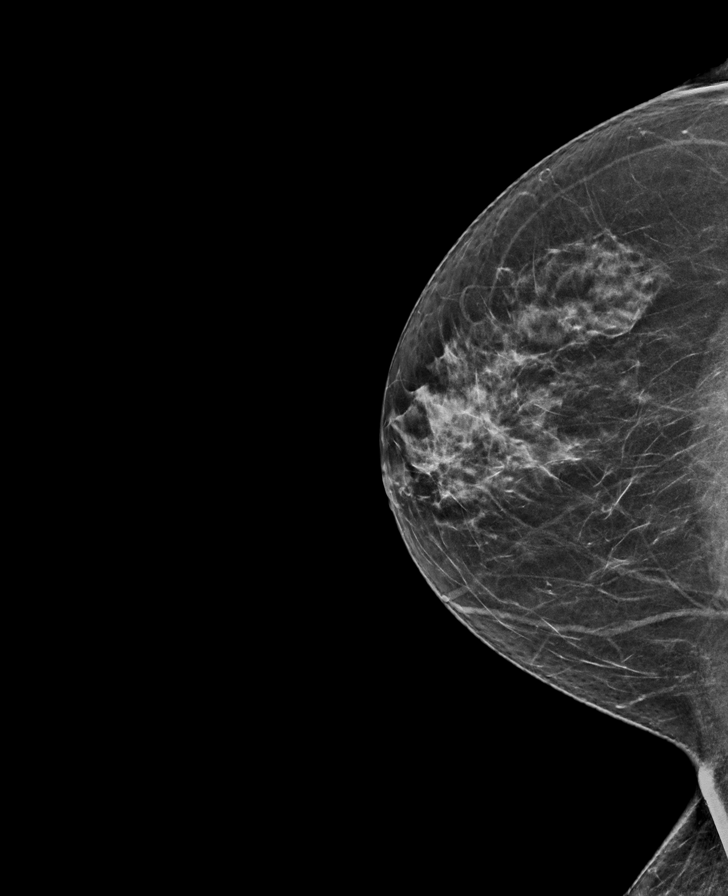

[L CC synth-2D]
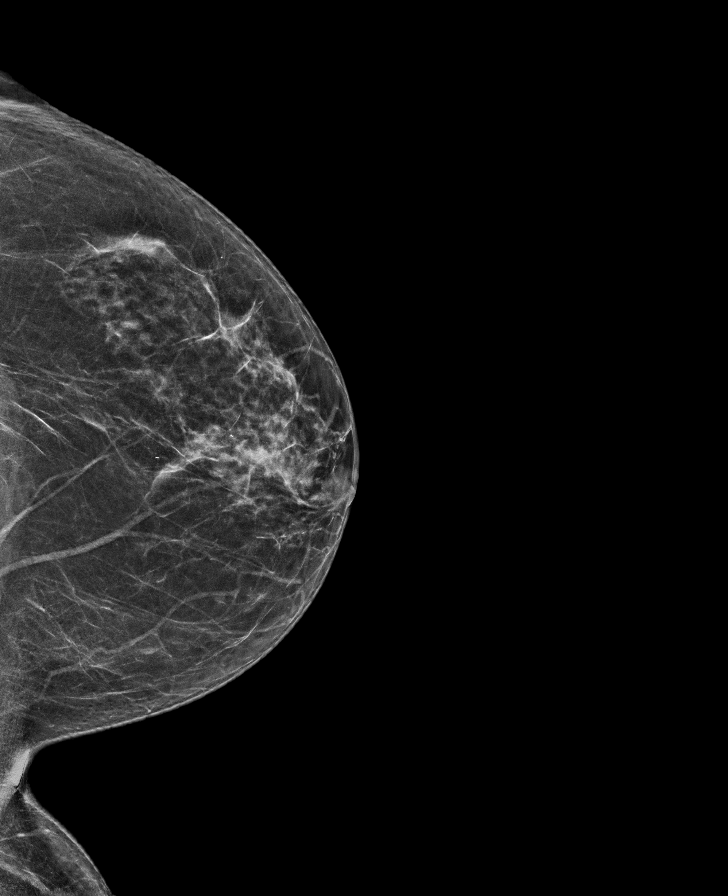

[L MLO synth-2D]
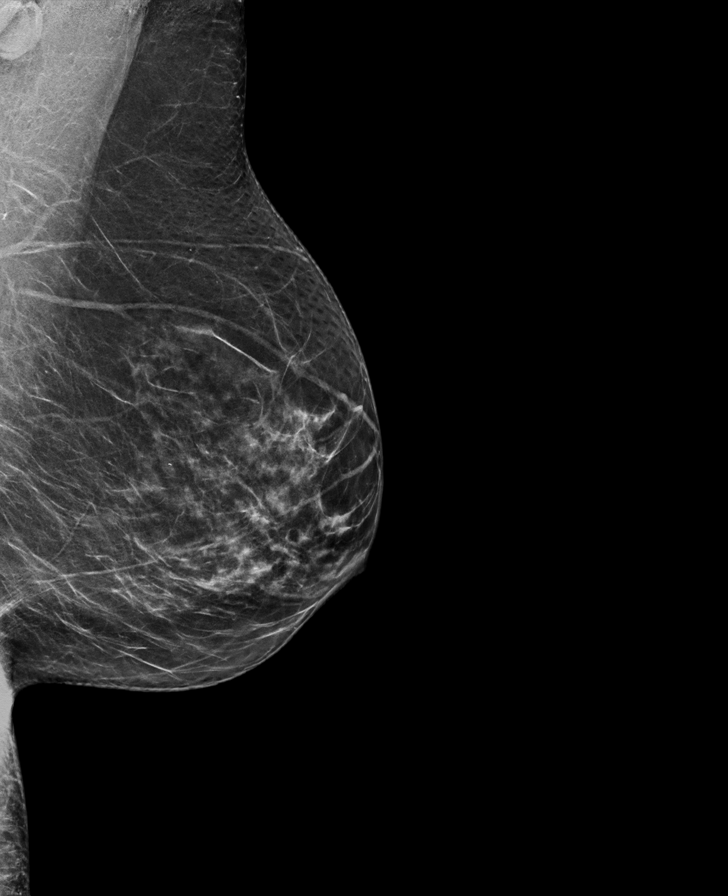

[L CC tomo · tomo slice 29/58.0]
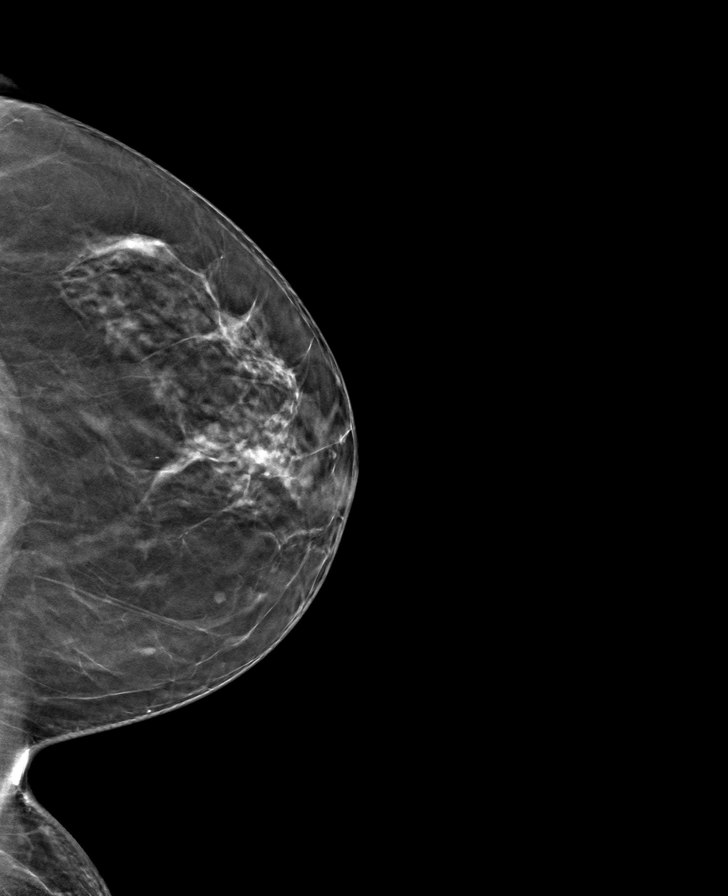

[R MLO tomo · tomo slice 30/59.0]
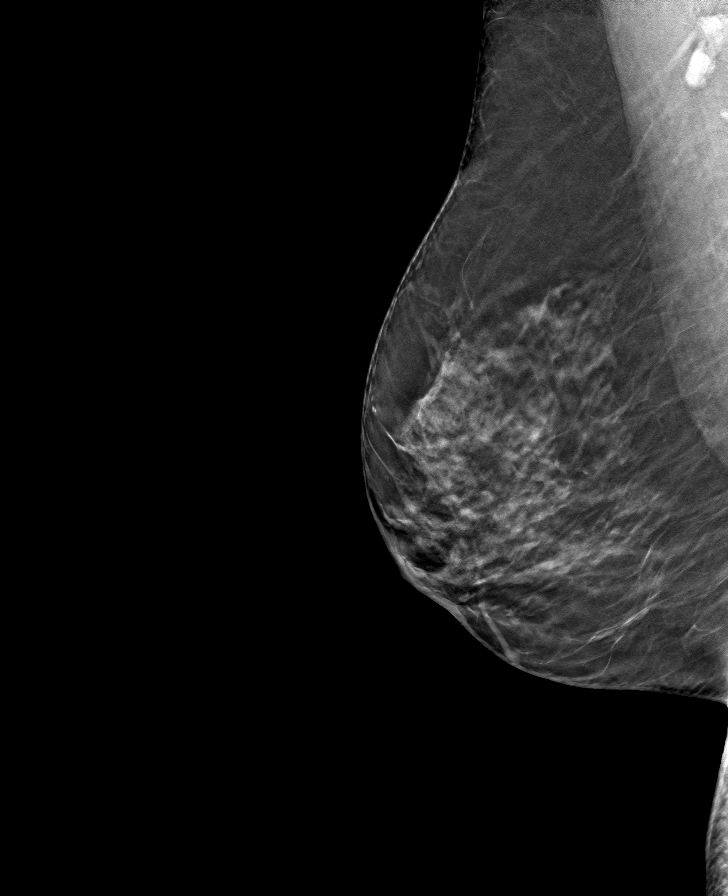

[L MLO tomo · tomo slice 32/63.0]
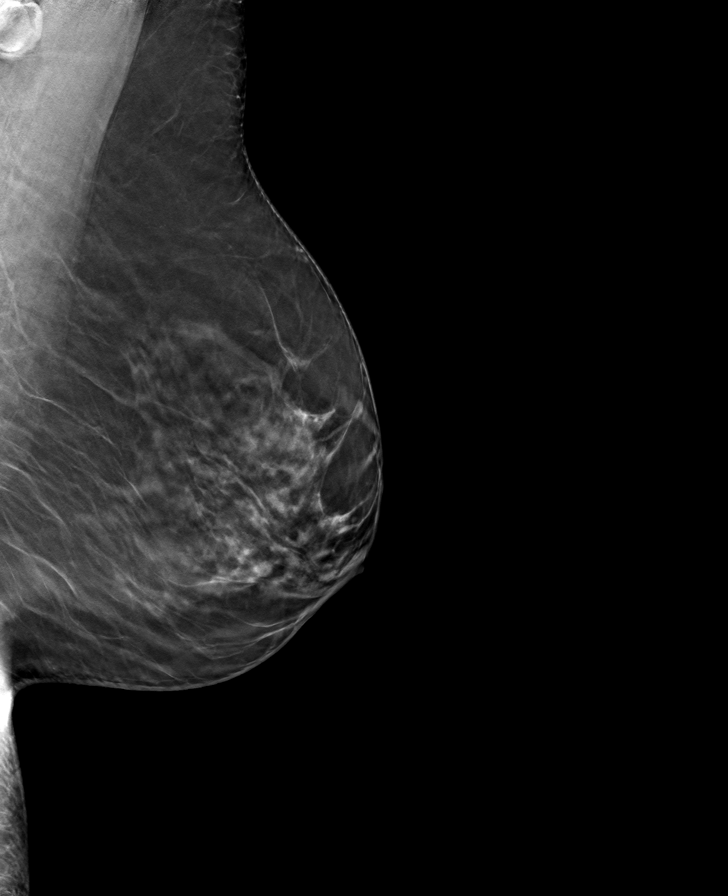

[R CC tomo · tomo slice 31/62.0]
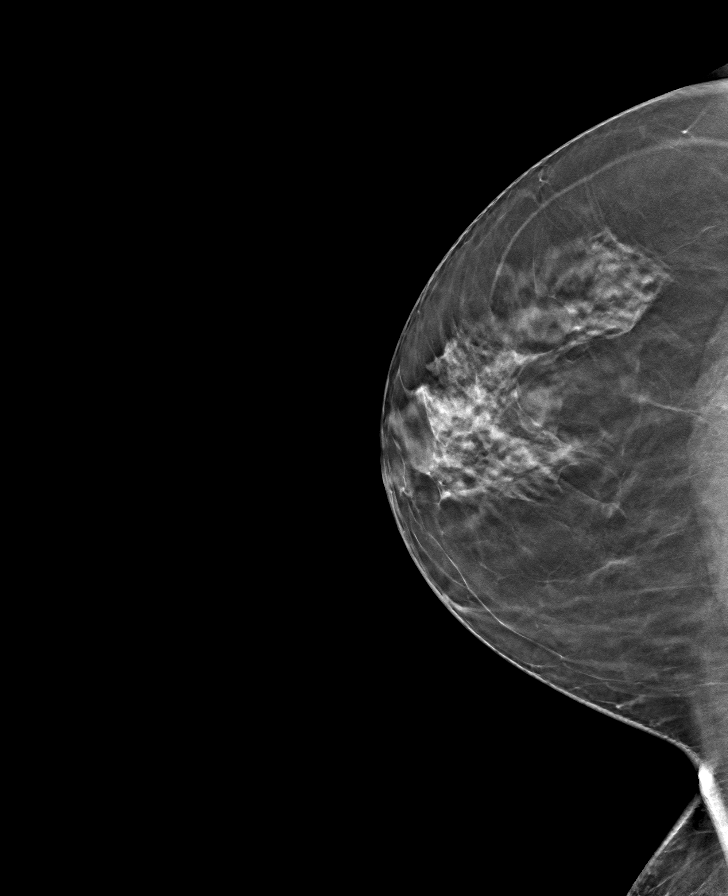

[8 of 24 positions shown; findings below may reference images not displayed]

ACR Breast Density Category c: The breast tissue is heterogeneously
dense, which may obscure small masses.
FINDINGS: There are no findings suspicious for malignancy.
IMPRESSION: No mammographic evidence of malignancy. A result letter of this
screening mammogram will be mailed directly to the patient.

RECOMMENDATION:
Screening mammogram in one year. (Code:Q3-W-BC3)

BI-RADS CATEGORY  1: Negative.

## 2023-11-05 ENCOUNTER — Other Ambulatory Visit: Payer: Self-pay | Admitting: Family Medicine

## 2023-11-05 DIAGNOSIS — Z1231 Encounter for screening mammogram for malignant neoplasm of breast: Secondary | ICD-10-CM

## 2023-11-06 ENCOUNTER — Other Ambulatory Visit: Payer: Self-pay | Admitting: Family Medicine

## 2023-11-06 ENCOUNTER — Ambulatory Visit
Admission: RE | Admit: 2023-11-06 | Discharge: 2023-11-06 | Disposition: A | Discharge status: Home / Self Care | Payer: Self-pay | Source: Ambulatory Visit | Attending: Family Medicine | Admitting: Family Medicine

## 2023-11-06 DIAGNOSIS — Z1231 Encounter for screening mammogram for malignant neoplasm of breast: Secondary | ICD-10-CM | POA: Insufficient documentation

## 2024-01-01 ENCOUNTER — Encounter: Payer: Self-pay | Admitting: Oncology

## 2024-01-01 ENCOUNTER — Inpatient Hospital Stay: Attending: Oncology | Admitting: Oncology

## 2024-01-01 ENCOUNTER — Inpatient Hospital Stay

## 2024-01-01 VITALS — BP 141/79 | HR 95 | Temp 98.1°F | Resp 18 | Wt 108.9 lb

## 2024-01-01 DIAGNOSIS — C480 Malignant neoplasm of retroperitoneum: Secondary | ICD-10-CM | POA: Insufficient documentation

## 2024-01-01 DIAGNOSIS — Z905 Acquired absence of kidney: Secondary | ICD-10-CM | POA: Diagnosis not present

## 2024-01-01 DIAGNOSIS — D539 Nutritional anemia, unspecified: Secondary | ICD-10-CM

## 2024-01-01 DIAGNOSIS — R42 Dizziness and giddiness: Secondary | ICD-10-CM | POA: Insufficient documentation

## 2024-01-01 DIAGNOSIS — N179 Acute kidney failure, unspecified: Secondary | ICD-10-CM | POA: Insufficient documentation

## 2024-01-01 DIAGNOSIS — D469 Myelodysplastic syndrome, unspecified: Secondary | ICD-10-CM | POA: Insufficient documentation

## 2024-01-01 LAB — CBC WITH DIFFERENTIAL/PLATELET
Abs Immature Granulocytes: 0.17 K/uL — ABNORMAL HIGH (ref 0.00–0.07)
Basophils Absolute: 0 K/uL (ref 0.0–0.1)
Basophils Relative: 0 %
Eosinophils Absolute: 0 K/uL (ref 0.0–0.5)
Eosinophils Relative: 0 %
HCT: 27 % — ABNORMAL LOW (ref 36.0–46.0)
Hemoglobin: 9 g/dL — ABNORMAL LOW (ref 12.0–15.0)
Immature Granulocytes: 2 %
Lymphocytes Relative: 9 %
Lymphs Abs: 0.7 K/uL (ref 0.7–4.0)
MCH: 35 pg — ABNORMAL HIGH (ref 26.0–34.0)
MCHC: 33.3 g/dL (ref 30.0–36.0)
MCV: 105.1 fL — ABNORMAL HIGH (ref 80.0–100.0)
Monocytes Absolute: 0.8 K/uL (ref 0.1–1.0)
Monocytes Relative: 9 %
Neutro Abs: 6.5 K/uL (ref 1.7–7.7)
Neutrophils Relative %: 80 %
Platelets: 91 K/uL — ABNORMAL LOW (ref 150–400)
RBC: 2.57 MIL/uL — ABNORMAL LOW (ref 3.87–5.11)
RDW: 15 % (ref 11.5–15.5)
Smear Review: NORMAL
WBC: 8.2 K/uL (ref 4.0–10.5)
nRBC: 1.3 % — ABNORMAL HIGH (ref 0.0–0.2)

## 2024-01-01 LAB — RETIC PANEL
Immature Retic Fract: 10.4 % (ref 2.3–15.9)
RBC.: 2.54 MIL/uL — ABNORMAL LOW (ref 3.87–5.11)
Retic Count, Absolute: 83.3 K/uL (ref 19.0–186.0)
Retic Ct Pct: 3.3 % — ABNORMAL HIGH (ref 0.4–3.1)
Reticulocyte Hemoglobin: 35.4 pg (ref >27.9)

## 2024-01-01 LAB — IRON AND TIBC
Iron: 127 ug/dL (ref 28–170)
Saturation Ratios: 47 % — ABNORMAL HIGH (ref 10.4–31.8)
TIBC: 273 ug/dL (ref 250–450)
UIBC: 146 ug/dL

## 2024-01-01 LAB — FOLATE: Folate: 12.2 ng/mL (ref >5.9)

## 2024-01-01 LAB — FERRITIN: Ferritin: 727 ng/mL — ABNORMAL HIGH (ref 11–307)

## 2024-01-01 LAB — VITAMIN B12: Vitamin B-12: 507 pg/mL (ref 180–914)

## 2024-01-01 LAB — LACTATE DEHYDROGENASE: LDH: 207 U/L (ref 105–235)

## 2024-01-01 LAB — TSH: TSH: 0.74 u[IU]/mL (ref 0.350–4.500)

## 2024-01-01 NOTE — Assessment & Plan Note (Addendum)
 Anemia likely secondary to kidney insufficiency with hemoglobin at 8.5. Differential includes vitamin deficiency, hemolysis, bone marrow disorders - ie chemotherapy induced MDS.  Discussed potential ESA use, requiring oncologist clearance due to cancer history.  Bone marrow biopsy considered if no other identifiable cause is found. Iron levels previously checked, no significant deficiency noted.   - check cbc, B12, folate, iron tibc ferritin, myeloma panel, LDH, haptoglobin etc.  - no need for iron infusion as iron indices indicate adequate iron store. - Consider bone marrow biopsy if no other identifiable cause is found. - Discuss potential ESA use if cleared by her oncologist.

## 2024-01-01 NOTE — Assessment & Plan Note (Signed)
-   Advised to avoid NSAIDs and stay hydrated. -follow up with nephrology, on prednisone.

## 2024-01-01 NOTE — Progress Notes (Signed)
 Hematology/Oncology Consult note [phone] [phone]        REFERRING PROVIDER: Valora Lynwood FALCON, MD   CHIEF COMPLAINTS/REASON FOR VISIT:  Evaluation of macrocytic anemia   ASSESSMENT & PLAN:   Macrocytic anemia Anemia likely secondary to kidney insufficiency with hemoglobin at 8.5. Differential includes vitamin deficiency, hemolysis, bone marrow disorders - ie chemotherapy induced MDS.  Discussed potential ESA use, requiring oncologist clearance due to cancer history.  Bone marrow biopsy considered if no other identifiable cause is found. Iron levels previously checked, no significant deficiency noted.   - check cbc, B12, folate, iron tibc ferritin, myeloma panel, LDH, haptoglobin etc.  - no need for iron infusion as iron indices indicate adequate iron store. - Consider bone marrow biopsy if no other identifiable cause is found. - Discuss potential ESA use if cleared by her oncologist.   Acute kidney injury - Advised to avoid NSAIDs and stay hydrated. -follow up with nephrology, on prednisone.   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, radiation, surgery Continue follow up with oncology at [facility]   Orders Placed This Encounter  Procedures   Ferritin    Standing Status:   Future    Number of Occurrences:   1    Expected Date:   01/01/2024    Expiration Date:   03/31/2024   Folate    Standing Status:   Future    Number of Occurrences:   1    Expected Date:   01/01/2024    Expiration Date:   03/31/2024   Iron and TIBC    Standing Status:   Future    Number of Occurrences:   1    Expected Date:   01/01/2024    Expiration Date:   03/31/2024   Vitamin B12    Standing Status:   Future    Number of Occurrences:   1    Expected Date:   01/01/2024    Expiration Date:   03/31/2024   CBC with Differential/Platelet    Standing Status:   Future    Number of Occurrences:   1    Expected Date:   01/01/2024    Expiration Date:   03/31/2024   Retic  Panel    Standing Status:   Future    Number of Occurrences:   1    Expected Date:   01/01/2024    Expiration Date:   03/31/2024   Multiple Myeloma Panel (SPEP&IFE w/QIG)    Standing Status:   Future    Number of Occurrences:   1    Expected Date:   01/01/2024    Expiration Date:   03/31/2024   Kappa/lambda light chains    Standing Status:   Future    Number of Occurrences:   1    Expected Date:   01/01/2024    Expiration Date:   03/31/2024   Lactate dehydrogenase    Standing Status:   Future    Number of Occurrences:   1    Expected Date:   01/01/2024    Expiration Date:   03/31/2024   Haptoglobin    Standing Status:   Future    Number of Occurrences:   1    Expected Date:   01/01/2024    Expiration Date:   03/31/2024   Technologist smear review    Standing Status:   Future    Expected Date:   01/01/2024    Expiration Date:   03/31/2024    Clinical information::   anemia,  history of chemo   TSH    Standing Status:   Future    Number of Occurrences:   1    Expected Date:   01/01/2024    Expiration Date:   12/31/2024    All questions were answered. The patient knows to call the clinic with any problems, questions or concerns.  Zelphia Cap, MD, PhD [facility] Hematology Oncology 01/01/2024   HISTORY OF PRESENTING ILLNESS:   Nicole Hale is a  53 y.o.  female with PMH listed below was seen in consultation at the request of  Valora Lynwood FALCON, MD  for evaluation of macrocytic anemia Discussed the use of AI scribe software for clinical note transcription with the patient, who gave verbal consent to proceed.   She experiences low energy, dizziness, and feeling cold, which affect her quality of life.  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 She has anemia during chemotherapy and radiation. [id number]   Post-treatment, her hemoglobin levels were normal in June 2025  but began to drop again in Nov 2025, also developed macrocytosis  Prior to nephrectomy her creatinine was normal and was stable at approximately 1.2 in July 2025. She developed kidney insufficiency in November 2025, after a contrast CT scan. kidney biopsy on 12/09/2023   Biopsy showed acute tubular injury. Patient was on prednisone with prophylactic bactrim.   Her appetite is fair. Denies nausea vomiting diarrhea.   MEDICAL HISTORY:  Past Medical History:  Diagnosis Date   Hypertension     SURGICAL HISTORY: Past Surgical History:  Procedure Laterality Date   ABDOMINAL HYSTERECTOMY      SOCIAL HISTORY: Social History   Socioeconomic History   Marital status: Married    Spouse name: Not on file   Number of children: Not on file   Years of education: Not on file   Highest education level: Not on file  Occupational History   Not on file  Tobacco Use   Smoking status: Never   Smokeless tobacco: Not on file  Substance and Sexual Activity   Alcohol use: Not Currently   Drug use: Never   Sexual activity: Not on file  Other Topics Concern   Not on file  Social History Narrative   Not on file   Social Drivers of Health   Financial Resource Strain: Low Risk (12/08/2023)   Received from [facility]   Overall Financial Resource Strain (CARDIA)    How hard is it for you to pay for the very basics like food, housing, medical care, and heating?: Not very hard  Food Insecurity: No Food Insecurity (12/08/2023)   Received from [facility]   Hunger Vital Sign    Within the past 12 months, you worried that your food would run out before you got the money to buy more.: Never true    Within the past 12 months, the food you bought just didn't last and you didn't have money to get more.: Never true  Transportation Needs: No Transportation Needs (12/08/2023)   Received from [facility] - Transportation     Lack of Transportation (Medical): No    Lack of Transportation (Non-Medical): No  Physical Activity: Not on file  Stress: Not on file  Social Connections: Not on file  Intimate Partner Violence: Patient Unable To Answer (06/16/2023)   Received [facility]  Humiliation, Afraid, Rape, and Kick questionnaire    Within the last year, have you been afraid of your partner or ex-partner?: Patient unable to answer    Within the last year, have you been humiliated or emotionally abused in other ways by your partner or ex-partner?: Patient unable to answer    Within the last year, have you been kicked, hit, slapped, or otherwise physically hurt by your partner or ex-partner?: Patient unable to answer    Within the last year, have you been raped or forced to have any kind of sexual activity by your partner or ex-partner?: Patient unable to answer    FAMILY HISTORY: Family History  Problem Relation Age of Onset   Breast cancer Neg Hx     ALLERGIES:  has no known allergies.  MEDICATIONS:  Current Outpatient Medications  Medication Sig Dispense Refill   nitrofurantoin, macrocrystal-monohydrate, (MACROBID) 100 MG capsule Take 100 mg by mouth every 12 (twelve) hours.     polyethylene glycol (MIRALAX / GLYCOLAX) 17 g packet Take 17 g by mouth daily.     Potassium Chloride 40 MEQ/15ML (20%) SOLN SMARTSIG:7.6 Milliliter(s) By Mouth Daily     predniSONE (DELTASONE) 20 MG tablet Take by mouth.     sodium bicarbonate 650 MG tablet Take 1,300 mg by mouth.     dicyclomine  (BENTYL ) 20 MG tablet Take 1 tablet (20 mg total) by mouth every 6 (six) hours. (Patient not taking: Reported on 01/01/2024) 120 tablet 1   famotidine  (PEPCID ) 20 MG tablet Take 1 tablet (20 mg total) by mouth 2 (two) times daily. (Patient not taking: Reported on 01/01/2024) 60 tablet 1   ibuprofen  (MOTRIN  IB) 200 MG tablet Take 3 tablets (600 mg total) by mouth every 6 (six) hours as needed. (Patient not taking: Reported on  01/01/2024) 60 tablet 0   ondansetron  (ZOFRAN  ODT) 4 MG disintegrating tablet Take 1 tablet (4 mg total) by mouth every 8 (eight) hours as needed for nausea or vomiting. (Patient not taking: Reported on 01/01/2024) 20 tablet 0   No current facility-administered medications for this visit.    Review of Systems  Constitutional:  Positive for fatigue. Negative for appetite change, chills and fever.  HENT:   Negative for hearing loss and voice change.   Eyes:  Negative for eye problems.  Respiratory:  Negative for chest tightness and cough.   Cardiovascular:  Negative for chest pain.  Gastrointestinal:  Negative for abdominal distention, abdominal pain, blood in stool, nausea and vomiting.  Endocrine: Negative for hot flashes.  Genitourinary:  Negative for difficulty urinating and frequency.   Musculoskeletal:  Negative for arthralgias.  Skin:  Negative for itching and rash.  Neurological:  Negative for extremity weakness.  Hematological:  Negative for adenopathy.  Psychiatric/Behavioral:  Negative for confusion.    PHYSICAL EXAMINATION:  Vitals:   01/01/24 1410  BP: (!) 141/79  Pulse: 95  Resp: 18  Temp: 98.1 F (36.7 C)  SpO2: 100%   Filed Weights   01/01/24 1410  Weight: 108 lb 14.4 oz (49.4 kg)    Physical Exam Constitutional:      General: She is not in acute distress. HENT:     Head: Normocephalic and atraumatic.  Eyes:     General: No scleral icterus. Cardiovascular:     Rate and Rhythm: Normal rate and regular rhythm.  Pulmonary:     Effort: Pulmonary effort is normal. No respiratory distress.     Breath sounds: Normal breath sounds. No wheezing.  Abdominal:  General: Bowel sounds are normal. There is no distension.     Palpations: Abdomen is soft.  Musculoskeletal:        General: No deformity. Normal range of motion.     Cervical back: Normal range of motion and neck supple.  Skin:    General: Skin is warm and dry.     Findings: No erythema or rash.   Neurological:     Mental Status: She is alert and oriented to person, place, and time. Mental status is at baseline.  Psychiatric:        Mood and Affect: Mood normal.     LABORATORY DATA:  I have reviewed the data as listed    Latest Ref Rng & Units 01/01/2024    2:59 PM 10/22/2022    3:53 PM 02/23/2013   12:52 AM  CBC  WBC 4.0 - 10.5 K/uL 8.2  9.9    Hemoglobin 12.0 - 15.0 g/dL 9.0  87.9    Hematocrit 36.0 - 46.0 % 27.0  36.9  37.8   Platelets 150 - 400 K/uL 91  370        Latest Ref Rng & Units 10/22/2022    3:53 PM 02/23/2013   12:52 AM 02/16/2013    3:09 PM  CMP  Glucose 70 - 99 mg/dL 890  882  84   BUN 6 - 20 mg/dL 8  9  10    Creatinine 0.44 - 1.00 mg/dL 9.38  9.23  9.23   Sodium 135 - 145 mmol/L 139  138  137   Potassium 3.5 - 5.1 mmol/L 3.6  3.8  3.8   Chloride 98 - 111 mmol/L 105  108  106   CO2 22 - 32 mmol/L 25  26  25    Calcium 8.9 - 10.3 mg/dL 9.0  8.5  9.0   Total Protein 6.5 - 8.1 g/dL 6.9   7.8   Total Bilirubin 0.3 - 1.2 mg/dL 0.8   0.4   Alkaline Phos 38 - 126 U/L 113   107   AST 15 - 41 U/L 22   34   ALT 0 - 44 U/L 18   36       RADIOGRAPHIC STUDIES: I have personally reviewed the radiological images as listed and agreed with the findings in the report. MM 3D SCREENING MAMMOGRAM BILATERAL BREAST Result Date: 11/10/2023 CLINICAL DATA:  Screening. EXAM: DIGITAL SCREENING BILATERAL MAMMOGRAM WITH TOMOSYNTHESIS AND CAD TECHNIQUE: Bilateral screening digital craniocaudal and mediolateral oblique mammograms were obtained. Bilateral screening digital breast tomosynthesis was performed. The images were evaluated with computer-aided detection. COMPARISON:  Previous exam(s). ACR Breast Density Category c: The breasts are heterogeneously dense, which may obscure small masses. FINDINGS: There are no findings suspicious for malignancy. IMPRESSION: No mammographic evidence of malignancy. A result letter of this screening mammogram will be mailed directly to the  patient. RECOMMENDATION: Screening mammogram in one year. (Code:SM-B-01Y) BI-RADS CATEGORY  1: Negative. Electronically Signed   By: Curtistine Noble   On: 11/10/2023 13:13

## 2024-01-01 NOTE — Assessment & Plan Note (Signed)
 S/p chemotherapy, radiation, surgery Continue follow up with oncology at [facility]

## 2024-01-03 LAB — HAPTOGLOBIN: Haptoglobin: 130 mg/dL (ref 33–346)

## 2024-01-05 LAB — MULTIPLE MYELOMA PANEL, SERUM
Albumin SerPl Elph-Mcnc: 4.2 g/dL (ref 2.9–4.4)
Albumin/Glob SerPl: 1.7 (ref 0.7–1.7)
Alpha 1: 0.2 g/dL (ref 0.0–0.4)
Alpha2 Glob SerPl Elph-Mcnc: 0.6 g/dL (ref 0.4–1.0)
B-Globulin SerPl Elph-Mcnc: 0.9 g/dL (ref 0.7–1.3)
Gamma Glob SerPl Elph-Mcnc: 0.9 g/dL (ref 0.4–1.8)
Globulin, Total: 2.6 g/dL (ref 2.2–3.9)
IgA: 114 mg/dL (ref 87–352)
IgG (Immunoglobin G), Serum: 1042 mg/dL (ref 586–1602)
IgM (Immunoglobulin M), Srm: 52 mg/dL (ref 26–217)
Total Protein ELP: 6.8 g/dL (ref 6.0–8.5)

## 2024-01-05 LAB — KAPPA/LAMBDA LIGHT CHAINS
Kappa free light chain: 18.4 mg/L (ref 3.3–19.4)
Kappa, lambda light chain ratio: 0.99 (ref 0.26–1.65)
Lambda free light chains: 18.6 mg/L (ref 5.7–26.3)

## 2024-01-07 ENCOUNTER — Ambulatory Visit: Payer: Self-pay | Admitting: Oncology

## 2024-01-08 ENCOUNTER — Other Ambulatory Visit: Payer: Self-pay

## 2024-01-08 DIAGNOSIS — D539 Nutritional anemia, unspecified: Secondary | ICD-10-CM

## 2024-01-08 NOTE — Progress Notes (Signed)
 Spoke to pt and she is agreeable to BM Bx. Request sent to IR. Date pending.

## 2024-01-12 NOTE — Progress Notes (Signed)
Appt reminders mailed.

## 2024-01-16 ENCOUNTER — Telehealth: Payer: Self-pay

## 2024-01-16 NOTE — Telephone Encounter (Signed)
 Patient for CT Bone Marrow Biopsy on Monday 01/19/24, I called and spoke with the patient on the phone and gave pre-procedure instructions. Pt was made aware to be here at 7:30a, NPO after MN prior to procedure as well as driver post procedure/recovery/discharge. Pt stated understanding.  Called 01/16/24

## 2024-01-18 ENCOUNTER — Other Ambulatory Visit: Payer: Self-pay | Admitting: Interventional Radiology

## 2024-01-18 DIAGNOSIS — Z01818 Encounter for other preprocedural examination: Secondary | ICD-10-CM

## 2024-01-18 NOTE — H&P (Signed)
 "    Chief Complaint: Patient was seen in consultation today for macrocytic anemia, with consideration for bone marrow biopsy and aspiration.  Referring Provider(s): Dr. Zelphia Cap, MD   Supervising Physician: Luverne Aran  Patient Status: [facility] - Out-pt  Patient is Full Code  History of Present Illness: Nicole Hale is a 53 y.o. 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  Per Dr. Layvonne Progress note dated 12/04: Macrocytic anemia Anemia likely secondary to kidney insufficiency with hemoglobin at 8.5. Differential includes vitamin deficiency, hemolysis, bone marrow disorders - ie chemotherapy induced MDS.  Discussed potential ESA use, requiring oncologist clearance due to cancer history.  Bone marrow biopsy considered if no other identifiable cause is found. Iron levels previously checked, no significant deficiency noted.    - check cbc, B12, folate, iron tibc ferritin, myeloma panel, LDH, haptoglobin etc.  - no need for iron infusion as iron indices indicate adequate iron store. - Consider bone marrow biopsy if no other identifiable cause is found. - Discuss potential ESA use if cleared by her oncologist.   Interventional Radiology was requested for bone marrow biopsy and aspiration. Patient is scheduled for same in IR today.   Patient is alert and laying in bed, calm.  Patient is currently without any significant complaints. She feels well. Patient denies any fevers, headache, chest pain, SOB, cough, abdominal pain, nausea, vomiting or bleeding.     Past Medical History:  Diagnosis Date   Hypertension     Past Surgical History:  Procedure Laterality Date   ABDOMINAL HYSTERECTOMY      Allergies: Patient has no known allergies.  Medications: Prior to Admission  medications  Medication Sig Start Date End Date Taking? Authorizing Provider  dicyclomine  (BENTYL ) 20 MG tablet Take 1 tablet (20 mg total) by mouth every 6 (six) hours. Patient not taking: Reported on 01/01/2024 10/22/22   Gasper Devere ORN, PA-C  famotidine  (PEPCID ) 20 MG tablet Take 1 tablet (20 mg total) by mouth 2 (two) times daily. Patient not taking: Reported on 01/01/2024 10/22/22 10/22/23  Gasper Devere ORN, PA-C  ibuprofen  (MOTRIN  IB) 200 MG tablet Take 3 tablets (600 mg total) by mouth every 6 (six) hours as needed. Patient not taking: Reported on 01/01/2024 08/11/18   Viviann Pastor, MD  nitrofurantoin, macrocrystal-monohydrate, (MACROBID) 100 MG capsule Take 100 mg by mouth every 12 (twelve) hours. 12/28/23   [provider]  ondansetron  (ZOFRAN  ODT) 4 MG disintegrating tablet Take 1 tablet (4 mg total) by mouth every 8 (eight) hours as needed for nausea or vomiting. Patient not taking: Reported on 01/01/2024 08/11/18   Viviann Pastor, MD  polyethylene glycol (MIRALAX / GLYCOLAX) 17 g packet Take 17 g by mouth daily.    [provider]  Potassium Chloride 40 MEQ/15ML (20%) SOLN SMARTSIG:7.6 Milliliter(s) By Mouth Daily    [provider]  predniSONE (DELTASONE) 20 MG tablet Take by mouth.    [provider]  sodium bicarbonate 650 MG tablet Take 1,300 mg by mouth. 12/22/23 03/21/24  [provider]     Family History  Problem Relation Age of Onset   Breast cancer Neg Hx     Social History   Socioeconomic History   Marital status: Married    Spouse name: [name]   Number of children: 2   Years of education: Not on file   Highest education level:  Not on file  Occupational History   Not on file  Tobacco Use   Smoking status: Never   Smokeless tobacco: Not on file  Substance and Sexual Activity   Alcohol use: Not Currently   Drug use: Never   Sexual activity: Not on file  Other Topics Concern   Not on file  Social History Narrative    Lives with husband at home.    Social Drivers of Health   Tobacco Use: Unknown (01/19/2024)   Patient History    Smoking Tobacco Use: Never    Smokeless Tobacco Use: Unknown    Passive Exposure: Not on file  Financial Resource Strain: Low Risk (12/08/2023)   Received [facility]   Overall Financial Resource Strain (CARDIA)    How hard is it for you to pay for the very basics like food, housing, medical care, and heating?: Not very hard  Food Insecurity: No Food Insecurity (12/08/2023)   Received from [facility]   Epic    Within the past 12 months, you worried that your food would run out before you got the money to buy more.: Never true    Within the past 12 months, the food you bought just didn't last and you didn't have money to get more.: Never true  Transportation Needs: No Transportation Needs (12/08/2023)   Received from [facility] - Transportation    Lack of Transportation (Medical): No    Lack of Transportation (Non-Medical): No  Physical Activity: Not on file  Stress: Not on file  Social Connections: Not on file  Depression (EYV7-0): Not on file  Alcohol Screen: Not on file  Housing: Unknown (09/03/2023)   Received from [facility]   Epic    At any time in the past 12 months, were you homeless or living in a shelter (including now)?: No    Number of Times Moved in the Last Year: Not on file    In the last 12 months, was there a time when you were not able to pay the mortgage or rent on time?: Patient refused  Utilities: Low Risk (12/08/2023)   Received from [facility]   Utilities    Within the past 12 months, have you been unable to get utilities(heat, electricity) when it was really needed?: No  Health Literacy: Not on file     Review of Systems: A 12 point ROS discussed and pertinent positives are indicated in the HPI above.  All other systems are negative.  Vital Signs: BP 124/66   Pulse 78   Temp 100 F  (37.8 C) (Temporal)   Resp 16   Ht 5' 1 (1.549 m)   Wt 108 lb (49 kg)   SpO2 98%   BMI 20.41 kg/m   Advance Care Plan: The advanced care place/surrogate decision maker was discussed at the time of visit and the patient did not wish to discuss or was not able to name a surrogate decision maker or provide an advance care plan.  Physical Exam Constitutional:      General: She is not in acute distress.    Appearance: Normal appearance.  HENT:     Mouth/Throat:     Mouth: Mucous membranes are dry.  Cardiovascular:     Rate and Rhythm: Normal rate and regular rhythm.     Heart sounds: No murmur heard. Pulmonary:     Effort: Pulmonary effort is normal.     Breath sounds: Normal  breath sounds. No wheezing.  Musculoskeletal:        General: Normal range of motion.     Cervical back: Normal range of motion.  Skin:    General: Skin is warm and dry.  Neurological:     Mental Status: She is alert and oriented to person, place, and time.  Psychiatric:        Mood and Affect: Mood normal.        Behavior: Behavior normal.        Thought Content: Thought content normal.        Judgment: Judgment normal.     Imaging: No results found.  Labs:  CBC: Recent Labs    01/01/24 1459  WBC 8.2  HGB 9.0*  HCT 27.0*  PLT 91*    COAGS: No results for input(s): INR, APTT in the last 8760 hours.  BMP: No results for input(s): NA, K, CL, CO2, GLUCOSE, BUN, CALCIUM, CREATININE, GFRNONAA, GFRAA in the last 8760 hours.  Invalid input(s): CMP  LIVER FUNCTION TESTS: No results for input(s): BILITOT, AST, ALT, ALKPHOS, PROT, ALBUMIN in the last 8760 hours.  TUMOR MARKERS: No results for input(s): AFPTM, CEA, CA199, CHROMGRNA in the last 8760 hours.  Assessment and Plan: Per Dr. Layvonne Progress note dated 12/04: Macrocytic anemia Anemia likely secondary to kidney insufficiency with hemoglobin at 8.5. Differential includes vitamin  deficiency, hemolysis, bone marrow disorders - ie chemotherapy induced MDS.  Discussed potential ESA use, requiring oncologist clearance due to cancer history.  Bone marrow biopsy considered if no other identifiable cause is found. Iron levels previously checked, no significant deficiency noted.    - check cbc, B12, folate, iron tibc ferritin, myeloma panel, LDH, haptoglobin etc.  - no need for iron infusion as iron indices indicate adequate iron store. - Consider bone marrow biopsy if no other identifiable cause is found. - Discuss potential ESA use if cleared by her oncologist.  Patient is now recommended for bone marrow biopsy and aspiration, and is in agreement with plan. She presents for same, scheduled in IR today.  Patient has been NPO since midnight.  All labs and medications are within acceptable parameters.  No known allergies are listed on file.  Risks and benefits of bone marrow biopsy and aspiration was discussed with the patient and/or patient's family including, but not limited to bleeding, infection, damage to adjacent structures or low yield requiring additional tests.  All of the questions were answered and there is agreement to proceed.  Consent signed and in chart.     Thank you for allowing our service to participate in ELLIYAH LISZEWSKI 's care.  Electronically Signed: Carlin DELENA Griffon, PA-C   01/19/2024, 7:58 AM      I spent a total of  30 Minutes   in face to face in clinical consultation, greater than 50% of which was counseling/coordinating care for macrocytic anemia, with consideration for bone marrow biopsy and aspiration.   "

## 2024-01-19 ENCOUNTER — Other Ambulatory Visit: Payer: Self-pay

## 2024-01-19 ENCOUNTER — Ambulatory Visit
Admission: RE | Admit: 2024-01-19 | Discharge: 2024-01-19 | Disposition: A | Discharge status: Home / Self Care | Source: Ambulatory Visit | Attending: Oncology | Admitting: Oncology

## 2024-01-19 DIAGNOSIS — D539 Nutritional anemia, unspecified: Secondary | ICD-10-CM | POA: Diagnosis present

## 2024-01-19 DIAGNOSIS — Z8589 Personal history of malignant neoplasm of other organs and systems: Secondary | ICD-10-CM | POA: Insufficient documentation

## 2024-01-19 DIAGNOSIS — Z9221 Personal history of antineoplastic chemotherapy: Secondary | ICD-10-CM | POA: Diagnosis not present

## 2024-01-19 DIAGNOSIS — Z01818 Encounter for other preprocedural examination: Secondary | ICD-10-CM

## 2024-01-19 LAB — CBC WITH DIFFERENTIAL/PLATELET
Abs Immature Granulocytes: 0.04 K/uL (ref 0.00–0.07)
Basophils Absolute: 0 K/uL (ref 0.0–0.1)
Basophils Relative: 0 %
Eosinophils Absolute: 0 K/uL (ref 0.0–0.5)
Eosinophils Relative: 0 %
HCT: 29.2 % — ABNORMAL LOW (ref 36.0–46.0)
Hemoglobin: 9.5 g/dL — ABNORMAL LOW (ref 12.0–15.0)
Immature Granulocytes: 1 %
Lymphocytes Relative: 27 %
Lymphs Abs: 1.9 K/uL (ref 0.7–4.0)
MCH: 36.4 pg — ABNORMAL HIGH (ref 26.0–34.0)
MCHC: 32.5 g/dL (ref 30.0–36.0)
MCV: 111.9 fL — ABNORMAL HIGH (ref 80.0–100.0)
Monocytes Absolute: 0.6 K/uL (ref 0.1–1.0)
Monocytes Relative: 8 %
Neutro Abs: 4.5 K/uL (ref 1.7–7.7)
Neutrophils Relative %: 64 %
Platelets: 77 K/uL — ABNORMAL LOW (ref 150–400)
RBC: 2.61 MIL/uL — ABNORMAL LOW (ref 3.87–5.11)
RDW: 16.5 % — ABNORMAL HIGH (ref 11.5–15.5)
Smear Review: NORMAL
WBC: 7.1 K/uL (ref 4.0–10.5)
nRBC: 0.3 % — ABNORMAL HIGH (ref 0.0–0.2)

## 2024-01-19 MED ORDER — MIDAZOLAM HCL (PF) 2 MG/2ML IJ SOLN
INTRAMUSCULAR | Status: AC | PRN
  Administered 2024-01-19 (×2): 1 mg via INTRAVENOUS

## 2024-01-19 MED ORDER — FENTANYL CITRATE (PF) 100 MCG/2ML IJ SOLN
INTRAMUSCULAR | Status: AC | PRN
  Administered 2024-01-19 (×3): 25 ug via INTRAVENOUS

## 2024-01-19 MED ORDER — FENTANYL CITRATE (PF) 100 MCG/2ML IJ SOLN
INTRAMUSCULAR | Status: AC
  Filled 2024-01-19: qty 2

## 2024-01-19 MED ORDER — SODIUM CHLORIDE 0.9 % IV SOLN: INTRAVENOUS | Status: DC

## 2024-01-19 MED ORDER — MIDAZOLAM HCL 2 MG/2ML IJ SOLN
INTRAMUSCULAR | Status: AC
  Filled 2024-01-19: qty 4

## 2024-01-19 MED ORDER — HEPARIN SOD (PORK) LOCK FLUSH 100 UNIT/ML IV SOLN
INTRAVENOUS | Status: AC
  Filled 2024-01-19: qty 5

## 2024-01-19 NOTE — Procedures (Signed)
 Interventional Radiology Procedure Note  Procedure: CT guided bone marrow aspiration and biopsy  Complications: None  EBL: < 10 mL  Findings: Aspirate and core biopsy performed of bone marrow in right iliac bone.  Plan: Bedrest supine x 1 hrs  Ericha Whittingham T. Fredia Sorrow, M.D Pager:  [phone]

## 2024-01-28 ENCOUNTER — Encounter (HOSPITAL_COMMUNITY): Payer: Self-pay

## 2024-01-30 ENCOUNTER — Ambulatory Visit: Payer: Self-pay | Admitting: Oncology

## 2024-01-31 ENCOUNTER — Emergency Department

## 2024-01-31 ENCOUNTER — Emergency Department
Admission: EM | Admit: 2024-01-31 | Discharge: 2024-01-31 | Disposition: A | Discharge status: Home / Self Care | Source: Home / Self Care | Attending: Emergency Medicine | Admitting: Emergency Medicine

## 2024-01-31 ENCOUNTER — Other Ambulatory Visit: Payer: Self-pay

## 2024-01-31 DIAGNOSIS — R197 Diarrhea, unspecified: Secondary | ICD-10-CM | POA: Insufficient documentation

## 2024-01-31 DIAGNOSIS — I10 Essential (primary) hypertension: Secondary | ICD-10-CM | POA: Insufficient documentation

## 2024-01-31 DIAGNOSIS — R112 Nausea with vomiting, unspecified: Secondary | ICD-10-CM | POA: Diagnosis present

## 2024-01-31 DIAGNOSIS — N179 Acute kidney failure, unspecified: Secondary | ICD-10-CM | POA: Insufficient documentation

## 2024-01-31 DIAGNOSIS — D72829 Elevated white blood cell count, unspecified: Secondary | ICD-10-CM | POA: Diagnosis not present

## 2024-01-31 DIAGNOSIS — E860 Dehydration: Secondary | ICD-10-CM | POA: Insufficient documentation

## 2024-01-31 LAB — URINALYSIS, ROUTINE W REFLEX MICROSCOPIC
Bilirubin Urine: NEGATIVE
Glucose, UA: >=500 mg/dL — AB
Hgb urine dipstick: NEGATIVE
Ketones, ur: NEGATIVE mg/dL
Leukocytes,Ua: NEGATIVE
Nitrite: NEGATIVE
Protein, ur: 30 mg/dL — AB
RBC / HPF: 0 RBC/hpf (ref 0–5)
Specific Gravity, Urine: 1.005 (ref 1.005–1.030)
pH: 7 (ref 5.0–8.0)

## 2024-01-31 LAB — COMPREHENSIVE METABOLIC PANEL WITH GFR
ALT: 56 U/L — ABNORMAL HIGH (ref 0–44)
AST: 51 U/L — ABNORMAL HIGH (ref 15–41)
Albumin: 4.4 g/dL (ref 3.5–5.0)
Alkaline Phosphatase: 409 U/L — ABNORMAL HIGH (ref 38–126)
Anion gap: 10 (ref 5–15)
BUN: 24 mg/dL — ABNORMAL HIGH (ref 6–20)
CO2: 15 mmol/L — ABNORMAL LOW (ref 22–32)
Calcium: 9.6 mg/dL (ref 8.9–10.3)
Chloride: 112 mmol/L — ABNORMAL HIGH (ref 98–111)
Creatinine, Ser: 2.25 mg/dL — ABNORMAL HIGH (ref 0.44–1.00)
GFR, Estimated: 25 mL/min — ABNORMAL LOW
Glucose, Bld: 125 mg/dL — ABNORMAL HIGH (ref 70–99)
Potassium: 5.1 mmol/L (ref 3.5–5.1)
Sodium: 138 mmol/L (ref 135–145)
Total Bilirubin: 0.3 mg/dL (ref 0.0–1.2)
Total Protein: 7 g/dL (ref 6.5–8.1)

## 2024-01-31 LAB — BASIC METABOLIC PANEL WITH GFR
Anion gap: 11 (ref 5–15)
BUN: 21 mg/dL — ABNORMAL HIGH (ref 6–20)
CO2: 17 mmol/L — ABNORMAL LOW (ref 22–32)
Calcium: 8.9 mg/dL (ref 8.9–10.3)
Chloride: 113 mmol/L — ABNORMAL HIGH (ref 98–111)
Creatinine, Ser: 1.86 mg/dL — ABNORMAL HIGH (ref 0.44–1.00)
GFR, Estimated: 31 mL/min — ABNORMAL LOW
Glucose, Bld: 90 mg/dL (ref 70–99)
Potassium: 3.8 mmol/L (ref 3.5–5.1)
Sodium: 141 mmol/L (ref 135–145)

## 2024-01-31 LAB — CBC
HCT: 30.6 % — ABNORMAL LOW (ref 36.0–46.0)
Hemoglobin: 9.5 g/dL — ABNORMAL LOW (ref 12.0–15.0)
MCH: 35.4 pg — ABNORMAL HIGH (ref 26.0–34.0)
MCHC: 31 g/dL (ref 30.0–36.0)
MCV: 114.2 fL — ABNORMAL HIGH (ref 80.0–100.0)
Platelets: 110 K/uL — ABNORMAL LOW (ref 150–400)
RBC: 2.68 MIL/uL — ABNORMAL LOW (ref 3.87–5.11)
RDW: 15.9 % — ABNORMAL HIGH (ref 11.5–15.5)
WBC: 13 K/uL — ABNORMAL HIGH (ref 4.0–10.5)
nRBC: 0.2 % (ref 0.0–0.2)

## 2024-01-31 LAB — LIPASE, BLOOD: Lipase: <10 U/L — ABNORMAL LOW (ref 11–51)

## 2024-01-31 MED ORDER — LACTATED RINGERS IV BOLUS
1000.0000 mL | Freq: Once | INTRAVENOUS | Status: AC
  Administered 2024-01-31: 1000 mL via INTRAVENOUS

## 2024-01-31 MED ORDER — ONDANSETRON HCL 4 MG/2ML IJ SOLN
4.0000 mg | Freq: Once | INTRAMUSCULAR | Status: AC
  Administered 2024-01-31: 4 mg via INTRAVENOUS
  Filled 2024-01-31: qty 2

## 2024-01-31 MED ORDER — ACETAMINOPHEN 500 MG PO TABS
1000.0000 mg | ORAL_TABLET | Freq: Once | ORAL | Status: AC
  Administered 2024-01-31: 1000 mg via ORAL
  Filled 2024-01-31: qty 2

## 2024-01-31 MED ORDER — LACTATED RINGERS IV BOLUS
500.0000 mL | Freq: Once | INTRAVENOUS | Status: AC
  Administered 2024-01-31: 500 mL via INTRAVENOUS

## 2024-01-31 NOTE — Discharge Instructions (Signed)
 As we discussed please stop your antibiotics.  Please drink plenty of fluids.  Please follow-up with your doctor on Wednesday as scheduled for recheck of your labs including kidney function.  Return to the emergency department for any worsening diarrhea concerns for dehydration or any other symptom personally concerning to yourself.

## 2024-01-31 NOTE — ED Provider Notes (Signed)
 "  [facility] Provider Note    Event Date/Time   First MD Initiated Contact with Patient 01/31/24 1552     (approximate)  History   Chief Complaint: Emesis  HPI  Nicole Hale is a 54 y.o. female with a past medical Struve hypertension, recent liposarcoma status post nephrectomy, presents to the emergency department for nausea vomiting diarrhea.  According to the patient over the last week or so she has been experiencing symptoms of nausea vomiting and diarrhea.  Took Imodium and that seemed to help quite a bit with the diarrhea but continues to be nauseated with intermittent episodes of vomiting.  Patient went to urgent care today and they did lab work they were concerned given her worsening kidney function as she is status post nephrectomy and only has 1 active kidney so they sent her to the emergency department for further workup.  Patient denies any abdominal pain.  Denies any known fever.  No urinary symptoms.  Patient states she recently completed a course of Keflex for possible urinary tract infection.  Physical Exam   Triage Vital Signs: ED Triage Vitals  Encounter Vitals Group     BP 01/31/24 1437 (!) 140/86     Girls Systolic BP Percentile --      Girls Diastolic BP Percentile --      Boys Systolic BP Percentile --      Boys Diastolic BP Percentile --      Pulse Rate 01/31/24 1437 (!) 106     Resp 01/31/24 1437 16     Temp 01/31/24 1437 97.8 F (36.6 C)     Temp Source 01/31/24 1437 Oral     SpO2 01/31/24 1437 98 %     Weight --      Height --      Head Circumference --      Peak Flow --      Pain Score 01/31/24 1438 0     Pain Loc --      Pain Education --      Exclude from Growth Chart --     Most recent vital signs: Vitals:   01/31/24 1437  BP: (!) 140/86  Pulse: (!) 106  Resp: 16  Temp: 97.8 F (36.6 C)  SpO2: 98%    General: Awake, no distress.  CV:  Good peripheral perfusion.  Regular rate and rhythm  Resp:  Normal  effort.  Equal breath sounds bilaterally.  Abd:  No distention.  Soft, nontender.  No rebound or guarding.  ED Results / Procedures / Treatments   RADIOLOGY  Ultrasound shows the patient has status post cholecystectomy.  No acute finding.   MEDICATIONS ORDERED IN ED: Medications  lactated ringers  bolus 1,000 mL (has no administration in time range)  lactated ringers  bolus 500 mL (has no administration in time range)  ondansetron  (ZOFRAN ) injection 4 mg (has no administration in time range)     IMPRESSION / MDM / ASSESSMENT AND PLAN / ED COURSE  I reviewed the triage vital signs and the nursing notes.  Patient's presentation is most consistent with acute presentation with potential threat to life or bodily function.  Patient presents to the emergency department for nausea vomiting diarrhea.  Patient is status post nephrectomy only has 1 active kidney.  Patient's lab work today shows a creatinine of 2.25 with a GFR of 25.  Patient's last GFR at [facility] is 36.  Patient's lab work today shows a mild leukocytosis of 13,000, chronic anemia.  Patient's LFTs are elevated slightly with an elevated AST ALT and alkaline phosphatase of 409.  I reviewed the patient's old labs in Care Everywhere the AST and ALT elevations are new the alkaline phosphatase is elevated more than it has been previously but she has previously had alkaline phosphatase elevations.  Patient also states she recently underwent a bone marrow biopsy.  She has a completely benign exam with no abdominal tenderness special attention paid to the right upper quadrant.  Will obtain a right upper quadrant ultrasound as a precaution to evaluate the gallbladder.  We will IV hydrate with 1500 cc of fluid dose Zofran  and continue to closely monitor.  Differential would include gallbladder/biliary pathology, dehydration, gastroenteritis or viral infection, urinary tract infection among other pathology.  Patient's ultrasound shows status  postcholecystectomy no significant finding.  Urinalysis is negative for urinary tract infection.  After IV fluids we have repeated the patient's blood work and her GFR has increased back to 31.  Patient appears well and states she is feeling much better.  We will discharge from the emergency department with instructions to drink plenty of fluids and follow-up with her nephrologist early this week for recheck of her lab work.  Patient agreeable to plan of care.  Discussed return precautions.  FINAL CLINICAL IMPRESSION(S) / ED DIAGNOSES   Nausea vomiting diarrhea Dehydration Acute kidney injury   Note:  This document was prepared using Dragon voice recognition software and may include unintentional dictation errors.   Dorothyann Drivers, MD 01/31/24 2018  "

## 2024-01-31 NOTE — ED Triage Notes (Signed)
 Pt to ED via POV from home. Pt reports believes she has food poisoning and reports N/V/D. Pt seen at [facility] and sent ot ER for further evaluation.

## 2024-02-02 NOTE — Progress Notes (Signed)
 Request for testing addon emailed to [employer] @ [facility] pathology lab

## 2024-02-04 ENCOUNTER — Inpatient Hospital Stay: Admitting: Oncology

## 2024-02-15 NOTE — Progress Notes (Addendum)
 "   Referring Provider: Loris Elyn PARAS, MD   PCP: Valora Lynwood Coup, MD  02/23/2024  ASSESSMENT/PLAN:  Nicole Hale is a 54 y.o. patient with a past medical history significant for liposarcoma s/p nephrectomy, AKI due to acute tubular injury with chronic active interstitial nephritis who is being seen in clinic for evaluation of AKI .  AKI on CKD Admitted Nov '25 with AKI to Cr 2.9, BL 1.1 after nephrectomy June '25. Biopsy 12/09/23 showed primarily acute tubular injury with some mild acute tubulointerstitial inflammation. Noted to have numerous small microspherical structures which could be artifact or nephrotoxic in etiology. She has been treated with a slow taper of prednisone for the inflammatory component of injury. Cr has downtrended to 1.67 as of 12/15 but then bumped to 2.4 in s/o diarrheal illness.  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 Current regimen liquid potassium 20 mEq, sodium bicarb 1300 mg TID, k-citra 15 mEq. 1/14 CMP with bicarb 20.9, k 4.2.  - Increase K-citra to 15 mEq to BID - stop liquid potassium.    Liposarcoma: S/p surgical resection June '25 including nephrectomy. S/p neodadjuvant AIM Adriamycin (doxorubicin), Ifosfamide, and Mesna.  - She has a soft tissue swelling in her left groin, with U/S showing 6.4 by 4.2 cm lipomatous lesion, not visualized on CT.  - Obtain MRI with and without contrast.   Hypertension: Losartan held in s/o AKI. BP's generally at goal <130/80.  - continue to hold losartan   Cytopenias: She is following with hematology. She is s/p BM biopsy at [facility] which was largely non-revealing. Would avoid EPO in s/o recent cancer hx.    Communication preferences: MyChart   The patient reports they are physically located in [place]  and is currently: at  home. I conducted a audio/video visit. I spent 19 minutes on the video call with the patient. I spent an additional 14 minutes on pre- and post-visit activities on the date of service .     [name] will follow up in 6 weeks (Mar 9)   I independently interpreted the bmp from 1/8 showing elevated Cr 2.4, improved but persistent metabolic acidosis, normal potassium. Reviewed records from ER visit 1/3,  prior nephrology, surg-onc, and med-oncology visits found in Epic.    Elyn PARAS Loris, MD Asst. Professor Division of Nephrology and Hypertension [facility]    Chief Complaint: AKI   HPI:  Ms. Nicole Hale is a 54 y.o. female with a past medical history of RP liposarcoma s/p nephrectomy who is was seen in consultation at the request of Loris Elyn PARAS, MD for evaluation of CKD.     She was admitted 11/6-11/13/25 with AKI to Cr 2.9 from baseline 1.1. She has a solitary right kidney and underwent a contrasted CT study approximately two weeks prior to admission, raising concern for contrast-induced nephropathy and possible acute tubular injury; dehydration from reduced oral intake may have contributed. Renal ultrasound showed mild right-sided hydronephrosis without obstructing lesion. Urine sediment revealed rare WBCs, few renal tubular epithelial cells, rare RBCs, and minimal granular material.  A kidney biopsy was performed on 11/11 showing primarily acute tubular injury with some mild acute tubulointerstitial inflammation. Noted to have numerous small microspherical structures which could be artifact or nephrotoxic in etiology.   She was treated with oral sodium bicarb  and oral potassium for electrolyte derangements.  She has a history of RP liposarcoma s/p surgical resection including nephrectomy in June '25. CT scan (11/22/23) showed no evidence of recurrence or metastasis. She follows with Dr. Lida for cancer related pain.  Interval:  Since last visit she was  seen in ER 1/3 for n/v/d x 1 week. BMP showed worse renal function with Cr up to 2.25 from recent nadir of 1.67. She received 1.5 L LR. Repeat Cr on 1/8 remained elevated at 2.4. She reports she's eating and drinking okay. She's been hydrating aggressively. She denies n/v, fevers. Urinating normal, no hematuria. She thinks she is gaining a little weight.     ROS:  11 systems reviewed and negative except those noted in the history of present illness  PAST MEDICAL HISTORY: Past Medical History[1]  ALLERGIES Patient has no known allergies.  SOCIAL HISTORY Social History [2]    FAMILY HISTORY Family History[3]   MEDICATIONS: Current Medications[4]  PHYSICAL EXAM:   There were no vitals filed for this visit.   Appears comfortable, generally well on telehealth visit. Speaking fluently, nl WOB.   MEDICAL DECISION MAKING  Labs reviewed and interpreted as per A&P Imaging reviewed and interpreted as per A&P           [1] No past medical history on file. [2] Social History Socioeconomic History   Marital status: Married    Spouse name: [name]  Tobacco Use   Smoking status: Never    Passive exposure: Never   Smokeless tobacco: Never  Vaping Use   Vaping status: Never Used  Substance and Sexual Activity   Alcohol use: Never   Drug use: Never   Sexual activity: Yes   Social Drivers of Health   Food Insecurity: No Food Insecurity (12/08/2023)   Hunger Vital Sign    Worried About Running Out of Food in the Last Year: Never true    Ran Out of Food in the Last Year: Never true  Tobacco Use: Unknown (02/20/2024)   Received [facility]   Patient History    Smoking Tobacco Use: Never    Smokeless Tobacco Use: Unknown  Transportation Needs: No Transportation Needs (12/08/2023)   PRAPARE - Transportation    Lack of Transportation (Medical): No    Lack of Transportation (Non-Medical): No  Housing: Low Risk (12/08/2023)   Housing    Within the past  12 months, have you ever stayed: outside, in a car, in a tent, in an overnight shelter, or temporarily in someone else's home (i.e. couch-surfing)?: No    Are you worried about losing your housing?: No  Utilities: Low Risk (12/08/2023)   Utilities    Within the past 12 months, have you been unable to get utilities (heat, electricity) when it was really needed?: No  Interpersonal Safety: Not At Risk (12/04/2023)   Interpersonal Safety    Unsafe Where You Currently Live: No    Physically Hurt by Anyone: No    Abused by Anyone: No  Financial Resource Strain: Low Risk (12/08/2023)   Overall Financial Resource Strain (CARDIA)    Difficulty of Paying Living Expenses: Not very hard  [3] Family History Problem Relation Age of Onset   Liver disease Mother   [4] Current Outpatient Medications  Medication Sig Dispense Refill   acetaminophen  (TYLENOL ) 500 MG tablet Take 2 tablets (1,000 mg total) by mouth every eight (8) hours. (Patient taking differently: Take 2 tablets (1,000 mg total) by mouth every eight (8) hours as  needed for pain.) 30 tablet 0   famotidine  (PEPCID ) 40 MG tablet Take 1 tablet (40 mg total) by mouth in the morning. 30 tablet 0   [Paused] losartan (COZAAR) 25 MG tablet Take 0.5 tablets (12.5 mg total) by mouth daily.     mirtazapine (REMERON) 15 MG tablet Take 1 tablet (15 mg total) by mouth nightly. 90 tablet 3   [Paused] pantoprazole (PROTONIX) 40 MG tablet Take 1 tablet (40 mg total) by mouth daily.     polyethylene glycol (MIRALAX) 17 gram packet Take 17 g by mouth two (2) times a day.     potassium chloride 40 mEq/15 mL Liqd Take 7.6 mL (20.2667 mEq total) by mouth daily. 473 mL 0   potassium citrate 15 mEq TbER Take 15 mEq by mouth in the morning. 30 tablet 1   prochlorperazine (COMPAZINE) 10 MG tablet Take 1 tablet (10 mg total) by mouth every six (6) hours as needed (nausea or vomitting). 30 tablet 3   sodium bicarbonate 650 mg tablet Take 2 tablets  (1,300 mg total) by mouth Three (3) times a day. 180 tablet 2   No current facility-administered medications for this visit.  "

## 2024-02-20 ENCOUNTER — Encounter (HOSPITAL_COMMUNITY): Payer: Self-pay

## 2024-02-20 ENCOUNTER — Inpatient Hospital Stay: Attending: Oncology | Admitting: Oncology

## 2024-02-20 ENCOUNTER — Encounter: Payer: Self-pay | Admitting: Oncology

## 2024-02-20 VITALS — BP 135/79 | HR 85 | Temp 97.6°F | Resp 20 | Wt 118.3 lb

## 2024-02-20 DIAGNOSIS — C480 Malignant neoplasm of retroperitoneum: Secondary | ICD-10-CM | POA: Diagnosis not present

## 2024-02-20 DIAGNOSIS — D469 Myelodysplastic syndrome, unspecified: Secondary | ICD-10-CM | POA: Diagnosis not present

## 2024-02-20 DIAGNOSIS — D539 Nutritional anemia, unspecified: Secondary | ICD-10-CM | POA: Insufficient documentation

## 2024-02-20 DIAGNOSIS — R42 Dizziness and giddiness: Secondary | ICD-10-CM | POA: Insufficient documentation

## 2024-02-20 DIAGNOSIS — N179 Acute kidney failure, unspecified: Secondary | ICD-10-CM | POA: Diagnosis not present

## 2024-02-20 DIAGNOSIS — Z905 Acquired absence of kidney: Secondary | ICD-10-CM | POA: Diagnosis not present

## 2024-02-20 NOTE — Assessment & Plan Note (Signed)
-   Advised to avoid NSAIDs and stay hydrated. -follow up with nephrology, on prednisone.

## 2024-02-20 NOTE — Assessment & Plan Note (Signed)
 S/p chemotherapy, radiation, surgery Continue follow up with oncology at [facility]

## 2024-02-20 NOTE — Progress Notes (Addendum)
 "  Hematology/Oncology Progress note [phone] [phone]           REFERRING PROVIDER: Valora Lynwood FALCON, MD   CHIEF COMPLAINTS/REASON FOR VISIT:  macrocytic anemia   ASSESSMENT & PLAN:   MDS (myelodysplastic syndrome) (HCC) Anemia likely secondary to kidney insufficiency with hemoglobin at 8.5. Differential includes vitamin deficiency, hemolysis, bone marrow disorders - ie chemotherapy induced MDS.  Discussed potential ESA use, requiring oncologist clearance due to cancer history.  Bone marrow biopsy considered if no other identifiable cause is found. Iron levels previously checked, no significant deficiency noted.   - check cbc, B12, folate, iron tibc ferritin, myeloma panel, LDH, haptoglobin etc.  - no need for iron infusion as iron indices indicate adequate iron store. - Discuss potential ESA use if cleared by her oncologist. - Bone marrow biopsy showed Normocellular bone marrow (40%) with trilineage hematopoiesis and mild dyspoiesis. 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  - repeat cbc next week. She is symptomatic, consider PRBC transfusion.   Addendum, I discussed with pathology regarding patient's cytogenetics and myeloid NGS.  Confirmed that patient has MDS, likely therapeutic induced.  IPSS-M risk 0.1 moderate high-, IPSS-R 4 intermediate, median overall survival 2.8 years -higher risk MDS Induction chemotherapy, with or without allogeneic HCT vs lower intensity therapies like HMA I will refer patient to [place] [facility] for second opinion. I discussed recommendation with patient through phone conversation.   Acute kidney injury - Advised to avoid NSAIDs and stay hydrated. -follow up with nephrology, on prednisone.   Liposarcoma, retroperitoneal (HCC) S/p chemotherapy, radiation, surgery Continue follow up with oncology at [facility]   Orders Placed This Encounter   Procedures   CBC with Differential (Cancer Center Only)    Standing Status:   Future    Expected Date:   02/24/2024    Expiration Date:   02/19/2025   Hemochromatosis DNA-PCR(c282y,h63d)    Standing Status:   Future    Expected Date:   02/24/2024    Expiration Date:   02/19/2025   Sample to Blood Bank    Standing Status:   Future    Expected Date:   02/24/2024    Expiration Date:   02/19/2025   ABO/Rh    Standing Status:   Future    Expected Date:   02/24/2024    Expiration Date:   02/19/2025   Follow up TBD All questions were answered. The patient knows to call the clinic with any problems, questions or concerns.  Zelphia Cap, MD, PhD [facility] Hematology Oncology 02/20/2024   HISTORY OF PRESENTING ILLNESS:   Nicole Hale is a  54 y.o.  female with PMH listed below was seen in consultation at the request of  Valora Lynwood FALCON, MD  for evaluation of macrocytic anemia Discussed the use of AI scribe software for clinical note transcription with the patient, who gave verbal consent to proceed.   She experiences low energy, dizziness, and feeling cold, which affect her quality of life.  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 She has anemia during chemotherapy and radiation. [id number]  Post-treatment, her hemoglobin levels were normal in June 2025  but began to drop again in Nov 2025, also developed macrocytosis  Prior to nephrectomy her creatinine was normal and was stable at approximately 1.2 in July 2025. She developed  kidney insufficiency in November 2025, after a contrast CT scan.   kidney biopsy on 12/09/2023   Biopsy showed acute tubular injury. Patient was on prednisone with prophylactic bactrim.   11/21/2023 CT chest abdomen pelvis w contrast showed  No thoracic metastatic disease.  Interval  resection of previously described large left-sided retroperitoneal mass (biopsy proven liposarcoma) without any definite new evidence to suggest locally recurrent or distant metastatic disease throughout the abdomen or pelvis.  Mild persistent hypoattenuating soft tissue seen within the left retroperitoneum along the distal aspect of the left-sided renal vein remnant with associated abutment of the gastric wall along its antropyloric region, presumed postsurgical in nature. Continued attention is recommended on follow-up.   Other chronic or incidental findings, as described within the body of the report.    INTERVAL HISTORY Nicole Hale is a 54 y.o. female who has above history reviewed by me today presents for follow up visit for macrocytic anemia.    She experiences significant fatigue and persistent weakness, describing herself as really weak and constantly very weak. She requests consideration of blood transfusion due to symptom burden. She denies chest pain, lightheadedness, dizziness, and shortness of breath.  01/19/2024 s/p bone marrow biopsy.  BONE MARROW, ASPIRATE, CLOT, CORE:  - Normocellular bone marrow (40%) with trilineage hematopoiesis and mild  dyspoiesis   Morphologic evaluation of the bone marrow reveals an essentially  normocellular bone marrow.  Occasional hypolobated/hypogranular  neutrophils and hypolobated megakaryocytes are noted.  However, fall  short of the threshold for dysplasia.  In the absence of secondary  causes, the findings may be suggestive of an evolving primary myeloid  neoplasm.  Correlation with pending cytogenetics and myeloid NGS panel  is recommended for further assessment   Cytogenetics showed Monosomy 7, NGS showed. PPM1D   MEDICAL HISTORY:  Past Medical History:  Diagnosis Date   Hypertension     SURGICAL HISTORY: Past Surgical History:  Procedure Laterality Date   ABDOMINAL HYSTERECTOMY      SOCIAL HISTORY: Social History    Socioeconomic History   Marital status: Married    Spouse name: [name]   Number of children: 2   Years of education: Not on file   Highest education level: Not on file  Occupational History   Not on file  Tobacco Use   Smoking status: Never   Smokeless tobacco: Not on file  Substance and Sexual Activity   Alcohol use: Not Currently   Drug use: Never   Sexual activity: Not on file  Other Topics Concern   Not on file  Social History Narrative   Lives with husband at home.    Social Drivers of Health   Tobacco Use: Unknown (02/20/2024)   Patient History    Smoking Tobacco Use: Never    Smokeless Tobacco Use: Unknown    Passive Exposure: Not on file  Financial Resource Strain: Low Risk (12/08/2023)   Received from [facility]   Overall Financial Resource Strain (CARDIA)    How hard is it for you to pay for the very basics like food, housing, medical care, and heating?: Not very hard  Food Insecurity: No Food Insecurity (12/08/2023)   Received from [facility]   Epic    Within the past 12 months, you worried that your food would run out before you got the money to buy more.: Never true    Within the past 12 months, the food you bought just didn't last and you didn't have money to get  more.: Never true  Transportation Needs: No Transportation Needs (12/08/2023)   Received from [facility] - Transportation    Lack of Transportation (Medical): No    Lack of Transportation (Non-Medical): No  Physical Activity: Not on file  Stress: Not on file  Social Connections: Not on file  Intimate Partner Violence: Patient Unable To Answer (06/16/2023)   Received from [facility]   Epic    Within the last year, have you been afraid of your partner or ex-partner?: Patient unable to answer    Within the last year, have you been humiliated or emotionally abused in other ways by your partner or ex-partner?: Patient unable to answer    Within the last year, have you  been kicked, hit, slapped, or otherwise physically hurt by your partner or ex-partner?: Patient unable to answer    Within the last year, have you been raped or forced to have any kind of sexual activity by your partner or ex-partner?: Patient unable to answer  Depression (PHQ2-9): Not on file  Alcohol Screen: Not on file  Housing: Unknown (09/03/2023)   Received from [facility]   Epic    At any time in the past 12 months, were you homeless or living in a shelter (including now)?: No    Number of Times Moved in the Last Year: Not on file    In the last 12 months, was there a time when you were not able to pay the mortgage or rent on time?: Patient refused  Utilities: Low Risk (12/08/2023)   Received from [facility]   Utilities    Within the past 12 months, have you been unable to get utilities(heat, electricity) when it was really needed?: No  Health Literacy: Not on file    FAMILY HISTORY: Family History  Problem Relation Age of Onset   Breast cancer Neg Hx     ALLERGIES:  is allergic to tramadol.  MEDICATIONS:  Current Outpatient Medications  Medication Sig Dispense Refill   polyethylene glycol (MIRALAX / GLYCOLAX) 17 g packet Take 17 g by mouth daily.     Potassium Chloride 40 MEQ/15ML (20%) SOLN SMARTSIG:7.6 Milliliter(s) By Mouth Daily     sodium bicarbonate 650 MG tablet Take 1,300 mg by mouth.     dicyclomine  (BENTYL ) 20 MG tablet Take 1 tablet (20 mg total) by mouth every 6 (six) hours. (Patient not taking: Reported on 02/20/2024) 120 tablet 1   famotidine  (PEPCID ) 20 MG tablet Take 1 tablet (20 mg total) by mouth 2 (two) times daily. (Patient not taking: Reported on 02/20/2024) 60 tablet 1   ibuprofen  (MOTRIN  IB) 200 MG tablet Take 3 tablets (600 mg total) by mouth every 6 (six) hours as needed. (Patient not taking: Reported on 02/20/2024) 60 tablet 0   nitrofurantoin, macrocrystal-monohydrate, (MACROBID) 100 MG capsule Take 100 mg by mouth every 12  (twelve) hours. (Patient not taking: Reported on 02/20/2024)     ondansetron  (ZOFRAN  ODT) 4 MG disintegrating tablet Take 1 tablet (4 mg total) by mouth every 8 (eight) hours as needed for nausea or vomiting. (Patient not taking: Reported on 02/20/2024) 20 tablet 0   predniSONE (DELTASONE) 20 MG tablet Take by mouth. (Patient not taking: Reported on 02/20/2024)     No current facility-administered medications for this visit.    Review of Systems  Constitutional:  Positive for fatigue. Negative for chills and fever.  HENT:   Negative for hearing loss and voice change.  Eyes:  Negative for eye problems.  Respiratory:  Negative for chest tightness, cough and shortness of breath.   Cardiovascular:  Negative for chest pain.  Gastrointestinal:  Negative for abdominal distention, abdominal pain, blood in stool, nausea and vomiting.  Endocrine: Negative for hot flashes.  Genitourinary:  Negative for difficulty urinating and frequency.   Musculoskeletal:  Negative for arthralgias.  Skin:  Negative for itching and rash.  Neurological:  Negative for extremity weakness.  Hematological:  Negative for adenopathy.  Psychiatric/Behavioral:  Negative for confusion.    PHYSICAL EXAMINATION:  Vitals:   02/20/24 1013  BP: 135/79  Pulse: 85  Resp: 20  Temp: 97.6 F (36.4 C)  SpO2: 100%   Filed Weights   02/20/24 1013  Weight: 118 lb 4.8 oz (53.7 kg)    Physical Exam Constitutional:      General: She is not in acute distress. HENT:     Head: Normocephalic and atraumatic.  Eyes:     General: No scleral icterus. Cardiovascular:     Rate and Rhythm: Normal rate and regular rhythm.  Pulmonary:     Effort: Pulmonary effort is normal. No respiratory distress.     Breath sounds: Normal breath sounds. No wheezing.  Abdominal:     General: Bowel sounds are normal. There is no distension.     Palpations: Abdomen is soft.  Musculoskeletal:        General: No deformity. Normal range of motion.      Cervical back: Normal range of motion and neck supple.  Skin:    General: Skin is warm and dry.     Findings: No erythema or rash.  Neurological:     Mental Status: She is alert and oriented to person, place, and time. Mental status is at baseline.  Psychiatric:        Mood and Affect: Mood normal.     LABORATORY DATA:  I have reviewed the data as listed    Latest Ref Rng & Units 01/31/2024    2:39 PM 01/19/2024    7:56 AM 01/01/2024    2:59 PM  CBC  WBC 4.0 - 10.5 K/uL 13.0  7.1  8.2   Hemoglobin 12.0 - 15.0 g/dL 9.5  9.5  9.0   Hematocrit 36.0 - 46.0 % 30.6  29.2  27.0   Platelets 150 - 400 K/uL 110  77  91       Latest Ref Rng & Units 01/31/2024    7:10 PM 01/31/2024    2:39 PM 10/22/2022    3:53 PM  CMP  Glucose 70 - 99 mg/dL 90  874  890   BUN 6 - 20 mg/dL 21  24  8    Creatinine 0.44 - 1.00 mg/dL 8.13  7.74  9.38   Sodium 135 - 145 mmol/L 141  138  139   Potassium 3.5 - 5.1 mmol/L 3.8  5.1  3.6   Chloride 98 - 111 mmol/L 113  112  105   CO2 22 - 32 mmol/L 17  15  25    Calcium 8.9 - 10.3 mg/dL 8.9  9.6  9.0   Total Protein 6.5 - 8.1 g/dL  7.0  6.9   Total Bilirubin 0.0 - 1.2 mg/dL  0.3  0.8   Alkaline Phos 38 - 126 U/L  409  113   AST 15 - 41 U/L  51  22   ALT 0 - 44 U/L  56  18       RADIOGRAPHIC  STUDIES: I have personally reviewed the radiological images as listed and agreed with the findings in the report. US  ABDOMEN LIMITED RUQ (LIVER/GB) Result Date: 01/31/2024 EXAM: Right Upper Quadrant Abdominal Ultrasound 01/31/2024 05:06:13 PM TECHNIQUE: Real-time ultrasonography of the right upper quadrant of the abdomen was performed. COMPARISON: US  Abdomen Limited 02/19/2018. CT abdomen and pelvis 11/30/2012. CLINICAL HISTORY: Elevated liver function tests (LFTs), nausea, vomiting, and diarrhea. FINDINGS: LIVER: Increased parenchymal echotexture throughout the liver suggesting diffuse fatty infiltration. No intrahepatic biliary ductal dilatation. No evidence of mass.  Hepatopetal flow in the portal vein. BILIARY SYSTEM: The gallbladder is surgically absent. The common bile duct measures 0.2 cm. RIGHT KIDNEY: Incidental finding of mild hydronephrosis in the incompletely visualized right kidney. No echogenic calculi. No mass. PANCREAS: Visualized portions of the pancreas are unremarkable. OTHER: No right upper quadrant ascites. IMPRESSION: 1. Increased parenchymal echotexture throughout the liver, suggesting diffuse hepatic steatosis. 2. Surgically absent gallbladder. Electronically signed by: Elsie Gravely MD 01/31/2024 05:28 PM EST RP Workstation: [id number]   CT BONE MARROW BIOPSY & ASPIRATION Result Date: 01/19/2024 CLINICAL DATA:  Macrocytic anemia and need for bone marrow biopsy. History of retroperitoneal liposarcoma. EXAM: CT GUIDED BONE MARROW ASPIRATION AND BIOPSY ANESTHESIA/SEDATION: Moderate (conscious) sedation was employed during this procedure. A total of Versed  2.0 mg and Fentanyl  75 mcg was administered intravenously. Moderate Sedation Time: 13 minutes. The patient's level of consciousness and vital signs were monitored continuously by radiology nursing throughout the procedure under my direct supervision. PROCEDURE: The procedure risks, benefits, and alternatives were explained to the patient. Questions regarding the procedure were encouraged and answered. The patient understands and consents to the procedure. A time out was performed prior to initiating the procedure. The right gluteal region was prepped with chlorhexidine. Sterile gown and sterile gloves were used for the procedure. Local anesthesia was provided with 1% Lidocaine . Under CT guidance, an 11 gauge On Control bone cutting needle was advanced from a posterior approach into the right iliac bone. Needle positioning was confirmed with CT. Initial non heparinized and heparinized aspirate samples were obtained of bone marrow. Core biopsy was performed via the On Control drill needle. COMPLICATIONS:  None FINDINGS: Inspection of initial aspirate did reveal visible particles. Intact core biopsy sample was obtained. IMPRESSION: CT guided bone marrow biopsy of right posterior iliac bone with both aspirate and core samples obtained. Electronically Signed   By: Marcey Moan M.D.   On: 01/19/2024 09:39         "

## 2024-02-24 ENCOUNTER — Inpatient Hospital Stay

## 2024-02-24 ENCOUNTER — Other Ambulatory Visit: Payer: Self-pay | Admitting: Oncology

## 2024-02-24 ENCOUNTER — Other Ambulatory Visit: Payer: Self-pay

## 2024-02-24 DIAGNOSIS — D539 Nutritional anemia, unspecified: Secondary | ICD-10-CM

## 2024-02-24 LAB — SAMPLE TO BLOOD BANK

## 2024-02-24 LAB — PREPARE RBC (CROSSMATCH)

## 2024-02-24 LAB — CBC WITH DIFFERENTIAL (CANCER CENTER ONLY)
Abs Immature Granulocytes: 0.01 10*3/uL (ref 0.00–0.07)
Basophils Absolute: 0 10*3/uL (ref 0.0–0.1)
Basophils Relative: 1 %
Eosinophils Absolute: 0 10*3/uL (ref 0.0–0.5)
Eosinophils Relative: 1 %
HCT: 26.7 % — ABNORMAL LOW (ref 36.0–46.0)
Hemoglobin: 8.5 g/dL — ABNORMAL LOW (ref 12.0–15.0)
Immature Granulocytes: 0 %
Lymphocytes Relative: 55 %
Lymphs Abs: 2 10*3/uL (ref 0.7–4.0)
MCH: 35.9 pg — ABNORMAL HIGH (ref 26.0–34.0)
MCHC: 31.8 g/dL (ref 30.0–36.0)
MCV: 112.7 fL — ABNORMAL HIGH (ref 80.0–100.0)
Monocytes Absolute: 0.6 10*3/uL (ref 0.1–1.0)
Monocytes Relative: 15 %
Neutro Abs: 1 10*3/uL — ABNORMAL LOW (ref 1.7–7.7)
Neutrophils Relative %: 28 %
Platelet Count: 165 10*3/uL (ref 150–400)
RBC: 2.37 MIL/uL — ABNORMAL LOW (ref 3.87–5.11)
RDW: 14.3 % (ref 11.5–15.5)
WBC Count: 3.7 10*3/uL — ABNORMAL LOW (ref 4.0–10.5)
nRBC: 0 % (ref 0.0–0.2)

## 2024-02-24 LAB — ABO/RH: ABO/RH(D): O POS

## 2024-02-25 ENCOUNTER — Inpatient Hospital Stay

## 2024-02-25 DIAGNOSIS — D539 Nutritional anemia, unspecified: Secondary | ICD-10-CM

## 2024-02-25 MED ORDER — SODIUM CHLORIDE 0.9% IV SOLUTION
250.0000 mL | INTRAVENOUS | Status: DC
  Administered 2024-02-25: 250 mL via INTRAVENOUS
  Filled 2024-02-25: qty 250

## 2024-02-25 MED ORDER — DIPHENHYDRAMINE HCL 25 MG PO TABS
25.0000 mg | ORAL_TABLET | Freq: Once | ORAL | Status: AC
  Administered 2024-02-25: 25 mg via ORAL
  Filled 2024-02-25: qty 1

## 2024-02-25 MED ORDER — ACETAMINOPHEN 325 MG PO TABS
650.0000 mg | ORAL_TABLET | Freq: Once | ORAL | Status: AC
  Administered 2024-02-25: 650 mg via ORAL
  Filled 2024-02-25: qty 2

## 2024-02-26 LAB — BPAM RBC
Blood Product Expiration Date: 202602192359
ISSUE DATE / TIME: 202601281334
Unit Type and Rh: 202602192359
Unit Type and Rh: 5100

## 2024-02-26 LAB — TYPE AND SCREEN
ABO/RH(D): O POS
Antibody Screen: NEGATIVE
Unit division: 0

## 2024-02-26 LAB — HEMOCHROMATOSIS DNA-PCR(C282Y,H63D)

## 2024-03-01 LAB — SURGICAL PATHOLOGY

## 2024-03-02 ENCOUNTER — Telehealth: Payer: Self-pay

## 2024-03-02 NOTE — Telephone Encounter (Signed)
 Spoke with Alyson at [facility] malignant heme. Referral was received.
